# Patient Record
Sex: Female | Born: 1960 | Race: White | Hispanic: No | Marital: Married | Smoking: Former smoker
Health system: Southern US, Community
[De-identification: ages and names within clinical notes are randomized; demographics above are authoritative.]

## PROBLEM LIST (undated history)

## (undated) DIAGNOSIS — J8410 Pulmonary fibrosis, unspecified: Secondary | ICD-10-CM

## (undated) DIAGNOSIS — G939 Disorder of brain, unspecified: Secondary | ICD-10-CM

## (undated) DIAGNOSIS — I10 Essential (primary) hypertension: Secondary | ICD-10-CM

## (undated) HISTORY — PX: MELANOMA EXCISION: SHX5266

## (undated) HISTORY — PX: CYST EXCISION: SHX5701

---

## 2009-07-25 ENCOUNTER — Ambulatory Visit: Payer: Self-pay | Admitting: Family Medicine

## 2009-07-26 ENCOUNTER — Ambulatory Visit: Payer: Self-pay | Admitting: Family Medicine

## 2009-08-02 ENCOUNTER — Encounter: Payer: Self-pay | Admitting: Family Medicine

## 2009-08-04 ENCOUNTER — Encounter: Payer: Self-pay | Admitting: Family Medicine

## 2009-08-17 ENCOUNTER — Ambulatory Visit: Payer: Self-pay | Admitting: Internal Medicine

## 2009-08-17 DIAGNOSIS — L270 Generalized skin eruption due to drugs and medicaments taken internally: Secondary | ICD-10-CM | POA: Insufficient documentation

## 2010-06-09 ENCOUNTER — Ambulatory Visit: Payer: Self-pay | Admitting: Emergency Medicine

## 2010-06-10 ENCOUNTER — Telehealth (INDEPENDENT_AMBULATORY_CARE_PROVIDER_SITE_OTHER): Payer: Self-pay | Admitting: *Deleted

## 2010-06-13 ENCOUNTER — Telehealth (INDEPENDENT_AMBULATORY_CARE_PROVIDER_SITE_OTHER): Payer: Self-pay

## 2010-08-30 ENCOUNTER — Ambulatory Visit
Admission: RE | Admit: 2010-08-30 | Discharge: 2010-08-30 | Payer: Self-pay | Source: Home / Self Care | Admitting: Family Medicine

## 2010-08-30 DIAGNOSIS — R05 Cough: Secondary | ICD-10-CM | POA: Insufficient documentation

## 2010-09-01 ENCOUNTER — Encounter: Payer: Self-pay | Admitting: Family Medicine

## 2010-09-02 ENCOUNTER — Telehealth (INDEPENDENT_AMBULATORY_CARE_PROVIDER_SITE_OTHER): Payer: Self-pay | Admitting: *Deleted

## 2010-09-29 NOTE — Progress Notes (Signed)
  Phone Note Outgoing Call Call back at [facility] Phone [phone]   Call placed by: Lajean Saver RN,  June 10, 2010 10:11 AM Call placed to: Patient Action Taken: Phone Call Completed Summary of Call: Return of patient's call. Patient called concerned about her antibiotic because after taking it yesterday she develpoed watery diarrhea and abdominal cramps. I advised her to eat a meal and wait about 30 minutes before taking it again.

## 2010-09-29 NOTE — Miscellaneous (Signed)
Summary: FLU SHOT FORM  FLU SHOT FORM   Imported By: Dannette Barbara 06/09/2010 12:16:24  _____________________________________________________________________  External Attachment:    Type:   Image     Comment:   External Document

## 2010-09-29 NOTE — Letter (Signed)
Summary: CONTROLLED MEDICATIONS PRESCRIPTION POLICY  CONTROLLED MEDICATIONS PRESCRIPTION POLICY   Imported By: Dannette Barbara 06/09/2010 11:32:39  _____________________________________________________________________  External Attachment:    Type:   Image     Comment:   External Document

## 2010-09-29 NOTE — Progress Notes (Signed)
  Phone Note Outgoing Call   Call placed by: Areta Haber CMA,  June 13, 2010 4:19 PM Call placed to: Patient Summary of Call: Courtesy call mess keft on pt's hm vm. Initial call taken by: Areta Haber CMA,  June 13, 2010 4:20 PM

## 2010-09-29 NOTE — Assessment & Plan Note (Signed)
Summary: COUGH AND PAIN ON LEFT SIDE   Vital Signs:  Patient Profile:   50 Years Old Female CC:      Cough x 3 weeks, Pain in left side under ribs during the night while sitting up in bed Height:     66 inches Weight:      225 pounds O2 Sat:      100 % O2 treatment:    Room Air Temp:     99.8 degrees F oral Pulse rate:   87 / minute Pulse rhythm:   regular Resp:     16 per minute BP sitting:   117 / 76  (left arm) Cuff size:   large  Vitals Entered By: Emilio Math (June 09, 2010 11:32 AM)                  Current Allergies (reviewed today): ! AMOXICILLIN ! KEFLEX ! CORTISONE ! ADHESIVE TAPEHistory of Present Illness History from: patient Chief Complaint: Cough x 3 weeks, Pain in left side under ribs during the night while sitting up in bed History of Present Illness: Cough for 3 weeks.  Started as URI, then passed to husband, then passed back to her.  At that time, was getting up from laying down and felt a quick twinge in LUQ.  Normal recent cardiac workup due to family history. Pain lasted a few seconds and then went away, but had pain with deep breaths and coughs for a few days after that.  Now mostly better.  Currently still with coughing, URI symptoms lasting a few weeks.  No true CP, SOB, radiation of pain, diaphoresis, fever, chills, N/V. +Diarrhea since she started Topamax a few months ago.  Current Meds NAPROSYN 500 MG TABS (NAPROXEN)  TOPAMAX 50 MG TABS (TOPIRAMATE)  PEPCID 20 MG TABS (FAMOTIDINE)  ZYRTEC ALLERGY 10 MG TABS (CETIRIZINE HCL)  DIPHENHYDRAMINE HCL 25 MG CAPS (DIPHENHYDRAMINE HCL)  ASPIRIN 325 MG TABS (ASPIRIN)  ADVIL 200 MG TABS (IBUPROFEN)  PREDNISONE (PAK) 10 MG TABS (PREDNISONE) use as directed (6 day pack) ZITHROMAX Z-PAK 250 MG TABS (AZITHROMYCIN) use as directed  REVIEW OF SYSTEMS Constitutional Symptoms      Denies fever, chills, night sweats, weight loss, weight gain, and fatigue.  Eyes       Denies change in vision, eye pain, eye  discharge, glasses, contact lenses, and eye surgery. Ear/Nose/Throat/Mouth       Complains of sinus problems.      Denies hearing loss/aids, change in hearing, ear pain, ear discharge, dizziness, frequent runny nose, frequent nose bleeds, sore throat, hoarseness, and tooth pain or bleeding.  Respiratory       Complains of dry cough.      Denies productive cough, wheezing, shortness of breath, asthma, bronchitis, and emphysema/COPD.  Cardiovascular       Denies murmurs, chest pain, and tires easily with exhertion.    Gastrointestinal       Denies stomach pain, nausea/vomiting, diarrhea, constipation, blood in bowel movements, and indigestion. Genitourniary       Denies painful urination, kidney stones, and loss of urinary control. Neurological       Complains of headaches.      Denies paralysis, seizures, and fainting/blackouts. Musculoskeletal       Complains of muscle pain.      Denies joint pain, joint stiffness, decreased range of motion, redness, swelling, muscle weakness, and gout.  Skin       Denies bruising, unusual mles/lumps or sores, and hair/skin or  nail changes.  Psych       Denies mood changes, temper/anger issues, anxiety/stress, speech problems, depression, and sleep problems.  Past History:  Past Medical History: Reviewed history from 07/26/2009 and no changes required. brain lesion migraines chronic back pain Hypertension  Past Surgical History: Reviewed history from 07/26/2009 and no changes required. tubal cyst removal malignant melanoma  Family History: Reviewed history from 07/26/2009 and no changes required. Family History High cholesterol Family History Hypertension  Social History: Reviewed history from 07/26/2009 and no changes required. Occupation:Teacher Married Former Smoker Alcohol use-no Drug use-no Physical Exam General appearance: well developed, well nourished, no acute distress Ears: normal, no lesions or deformities Nasal: mucosa  pink, nonedematous, no septal deviation, turbinates normal Oral/Pharynx: tongue normal, posterior pharynx without erythema or exudate Chest/Lungs: no rales, wheezes, or rhonchi bilateral, breath sounds equal without effort Heart: regular rate and  rhythm, no murmur Abdomen: TTP LUQ and along the L 10th rib intercostal area Skin: no obvious rashes or lesions MSE: oriented to time, place, and person Assessment New Problems: UPPER RESPIRATORY INFECTION, ACUTE (ICD-465.9)  URI with cough due to allergic rhinitis, pain is likely costochondritis and is improved (normal recent cardiac workup)  Patient Education: Patient and/or caregiver instructed in the following: rest, fluids, Ibuprofen prn.  Plan New Medications/Changes: ZITHROMAX Z-PAK 250 MG TABS (AZITHROMYCIN) use as directed  #1 x 0, 06/09/2010, Hoyt Koch MD PREDNISONE (PAK) 10 MG TABS (PREDNISONE) use as directed (6 day pack)  #QS x 0, 06/09/2010, Hoyt Koch MD  New Orders: Est. Patient Level III [16109] Influenza non-MCR [00028] Planning Comments:   Antibiotic as directed Use the prednisone as directed (if any reaction, stop med and call physician) Rest, hydration, OTC cough meds Follow-up with your primary care physician if not improving or if getting worse. If CP, SOB, sweating, worsening chest symptoms, go directly to ER   The patient and/or caregiver has been counseled thoroughly with regard to medications prescribed including dosage, schedule, interactions, rationale for use, and possible side effects and they verbalize understanding.  Diagnoses and expected course of recovery discussed and will return if not improved as expected or if the condition worsens. Patient and/or caregiver verbalized understanding.  Prescriptions: ZITHROMAX Z-PAK 250 MG TABS (AZITHROMYCIN) use as directed  #1 x 0   Entered and Authorized by:   Hoyt Koch MD   Signed by:   Hoyt Koch MD on 06/09/2010   Method used:    Print then Give to Patient   RxID:   [id number] PREDNISONE (PAK) 10 MG TABS (PREDNISONE) use as directed (6 day pack)  #QS x 0   Entered and Authorized by:   Hoyt Koch MD   Signed by:   Hoyt Koch MD on 06/09/2010   Method used:   Print then Give to Patient   RxID:   [id number]   Orders Added: 1)  Est. Patient Level III [29528] 2)  Influenza non-MCR [00028]   Influenza Vaccine    Mfr: Fluzone    Dose: 0.5 ml    Route: IM    Given by: Emilio Math    Exp. Date: 02/27/2011    Lot #: [id number]    VIS given: 03/24/10 version given June 09, 2010.  Flu Vaccine Consent Questions    Do you have a history of severe allergic reactions to this vaccine? no    Any prior history of allergic reactions to egg and/or gelatin? no    Do you have a sensitivity to the preservative Thimersol? no  Do you have a past history of Guillan-Barre Syndrome? no    Do you currently have an acute febrile illness? no    Have you ever had a severe reaction to latex? no    Vaccine information given and explained to patient? yes    Are you currently pregnant? no

## 2010-10-01 NOTE — Assessment & Plan Note (Signed)
Summary: COUGH,DIZZINESS,NAUSEA,VOMITING,WEAK,HEADACHE/WSE room 5   Vital Signs:  Patient Profile:   50 Years Old Female CC:      cough, N/V, dizzy Height:     66 inches Weight:      230.50 pounds O2 Sat:      96 % O2 treatment:    Room Air Temp:     99.7 degrees F oral Pulse rate:   97 / minute Resp:     18 per minute BP sitting:   134 / 83  (left arm) Cuff size:   large  Vitals Entered By: Clemens Catholic LPN (August 30, 2010 5:52 PM)                  Updated Prior Medication List: TOPAMAX 50 MG TABS (TOPIRAMATE)  PEPCID 20 MG TABS (FAMOTIDINE)  ZYRTEC ALLERGY 10 MG TABS (CETIRIZINE HCL)  DIPHENHYDRAMINE HCL 25 MG CAPS (DIPHENHYDRAMINE HCL)  ADVIL 200 MG TABS (IBUPROFEN)   Current Allergies (reviewed today): ! AMOXICILLIN ! KEFLEX ! CORTISONE ! ADHESIVE TAPEHistory of Present Illness Chief Complaint: cough, N/V, dizzy History of Present Illness:  Subjective:  Patient complains of a persistent cough.  She states that she was treated for pneumonia in December with Biaxin for  two weeks.  She states that she had a CT scan in December and that there are two calcified granulomas in her left chest that are being "watched."  Over the past 4 days she has developed increased cough which awakens her at night.  She has been more fatigued and developed low grade fever today.  She has occasional wheezing and anterior chest tightness with deep inspiration.  She states that she has had a flu shot.  No recent increase in leg swelling.  REVIEW OF SYSTEMS Constitutional Symptoms      Denies fever, chills, night sweats, weight loss, weight gain, and fatigue.  Eyes       Complains of glasses and contact lenses.      Denies change in vision, eye pain, eye discharge, and eye surgery. Ear/Nose/Throat/Mouth       Complains of hoarseness.      Denies hearing loss/aids, change in hearing, ear pain, ear discharge, dizziness, frequent runny nose, frequent nose bleeds, sinus problems, sore  throat, and tooth pain or bleeding.  Respiratory       Complains of dry cough, wheezing, shortness of breath, and bronchitis.      Denies productive cough, asthma, and emphysema/COPD.  Cardiovascular       Complains of chest pain and tires easily with exhertion.      Denies murmurs.    Gastrointestinal       Complains of stomach pain, nausea/vomiting, and diarrhea.      Denies constipation, blood in bowel movements, and indigestion. Genitourniary       Denies painful urination, kidney stones, and loss of urinary control. Neurological       Complains of headaches.      Denies paralysis, seizures, and fainting/blackouts. Musculoskeletal       Denies muscle pain, joint pain, joint stiffness, decreased range of motion, redness, swelling, muscle weakness, and gout.  Skin       Denies bruising, unusual mles/lumps or sores, and hair/skin or nail changes.  Psych       Denies mood changes, temper/anger issues, anxiety/stress, speech problems, depression, and sleep problems. Other Comments: pt statest that she saw her PCP 3-4 wks ago and had a chest CT performed which showed pneumonia, hse was treated  with 2 rounds of abt.  she completed abt on 08/24/10 and started with cough, dizzy, N/V/D, fatigue, HA, low grade fever x 2-3 days ago.   Past History:  Past Medical History: Reviewed history from 07/26/2009 and no changes required. brain lesion migraines chronic back pain Hypertension  Past Surgical History: Reviewed history from 07/26/2009 and no changes required. tubal cyst removal malignant melanoma  Family History: Family History High cholesterol Family History Hypertension MI   Objective:  No acute distress  Eyes:  Pupils are equal, round, and reactive to light and accomdation.  Extraocular movement is intact.  Conjunctivae are not inflamed.  Ears:  Canals normal.  Tympanic membranes normal.   Nose:  Minimal congestion Pharynx:  Normal  Neck:  Supple.  No adenopathy is  present.   Lungs:  Clear to auscultation.  Breath sounds are equal.  Heart:  Regular rate and rhythm without murmurs, rubs, or gallops.  Abdomen:  Nontender without masses or hepatosplenomegaly.  Bowel sounds are present.  No CVA or flank tenderness.  Extremities:  No edema.  Skin:  No rash CBC:  WBC 6.6 Assessment New Problems: COUGH (ICD-786.2)  WILL DEFER CHEST X-RAY BECAUSE SHE RECENTLY HAD CT CHEST AND LUNGS CLEAR TODAY.  NORMAL WHITE COUNT TODAY. SUSPECT BRONCHOSPASM.  Plan New Medications/Changes: BENZONATATE 200 MG CAPS (BENZONATATE) One by mouth hs as needed cough  #12 x 0, 08/30/2010, Donna Christen MD PROAIR HFA 108 (90 BASE) MCG/ACT AERS (ALBUTEROL SULFATE) Two inhalations q4-6hr as needed.  Max 12 puffs/day  #1 MDI x 1, 08/30/2010, Donna Christen MD ADVAIR DISKUS 100-50 MCG/DOSE MISC (FLUTICASONE-SALMETEROL) 1 inhalation two times a day  #1 disp pk 60 x 1, 08/30/2010, Donna Christen MD  New Orders: Pulse Oximetry [94760] Est. Patient Level IV [91478] CBC w/Diff [[id number]] Planning Comments:   Begin a trial of Advair two times a day, and albuterol inhaler as needed.  Advised to stop any antihistamines for now. If not improving recommend follow-up with pulmonologist.   The patient and/or caregiver has been counseled thoroughly with regard to medications prescribed including dosage, schedule, interactions, rationale for use, and possible side effects and they verbalize understanding.  Diagnoses and expected course of recovery discussed and will return if not improved as expected or if the condition worsens. Patient and/or caregiver verbalized understanding.  Prescriptions: BENZONATATE 200 MG CAPS (BENZONATATE) One by mouth hs as needed cough  #12 x 0   Entered and Authorized by:   Donna Christen MD   Signed by:   Donna Christen MD on 08/30/2010   Method used:   Print then Give to Patient   RxID:   [id number] PROAIR HFA 108 (90 BASE) MCG/ACT AERS (ALBUTEROL SULFATE)  Two inhalations q4-6hr as needed.  Max 12 puffs/day  #1 MDI x 1   Entered and Authorized by:   Donna Christen MD   Signed by:   Donna Christen MD on 08/30/2010   Method used:   Print then Give to Patient   RxID:   [id number] ADVAIR DISKUS 100-50 MCG/DOSE MISC (FLUTICASONE-SALMETEROL) 1 inhalation two times a day  #1 disp pk 60 x 1   Entered and Authorized by:   Donna Christen MD   Signed by:   Donna Christen MD on 08/30/2010   Method used:   Print then Give to Patient   RxID:   [id number]   Patient Instructions: 1)  Take Mucinex  (guaifenesin) twice daily for congestion. 2)  Increase fluid intake, rest. 3)    4)  Followup with family doctor if fever persists or increasing shortness of breath, chest pain develop.   Orders Added: 1)  Pulse Oximetry [94760] 2)  Est. Patient Level IV [40102] 3)  CBC w/Diff [[id number]]

## 2010-10-01 NOTE — Progress Notes (Signed)
  Phone Note Outgoing Call Call back at [facility] Phone [phone]   Call placed by: Lajean Saver RN,  September 02, 2010 11:52 AM Call placed to: Patient Action Taken: Phone Call Completed Summary of Call: Callback: Patient reports she is feeling much better.

## 2011-01-14 ENCOUNTER — Inpatient Hospital Stay (INDEPENDENT_AMBULATORY_CARE_PROVIDER_SITE_OTHER)
Admission: RE | Admit: 2011-01-14 | Discharge: 2011-01-14 | Disposition: A | Discharge status: Home / Self Care | Payer: 59 | Source: Ambulatory Visit | Attending: Emergency Medicine | Admitting: Emergency Medicine

## 2011-01-14 ENCOUNTER — Encounter: Payer: Self-pay | Admitting: Emergency Medicine

## 2011-01-14 DIAGNOSIS — L2089 Other atopic dermatitis: Secondary | ICD-10-CM | POA: Insufficient documentation

## 2011-01-14 DIAGNOSIS — J069 Acute upper respiratory infection, unspecified: Secondary | ICD-10-CM

## 2011-01-14 LAB — CONVERTED CEMR LAB: Rapid Strep: NEGATIVE

## 2011-01-16 ENCOUNTER — Telehealth (INDEPENDENT_AMBULATORY_CARE_PROVIDER_SITE_OTHER): Payer: Self-pay

## 2011-02-19 ENCOUNTER — Encounter: Payer: Self-pay | Admitting: Emergency Medicine

## 2011-02-19 ENCOUNTER — Inpatient Hospital Stay (INDEPENDENT_AMBULATORY_CARE_PROVIDER_SITE_OTHER)
Admission: RE | Admit: 2011-02-19 | Discharge: 2011-02-19 | Disposition: A | Discharge status: Home / Self Care | Payer: 59 | Source: Ambulatory Visit | Attending: Emergency Medicine | Admitting: Emergency Medicine

## 2011-02-19 DIAGNOSIS — J069 Acute upper respiratory infection, unspecified: Secondary | ICD-10-CM

## 2011-02-19 DIAGNOSIS — R350 Frequency of micturition: Secondary | ICD-10-CM

## 2011-02-19 DIAGNOSIS — R05 Cough: Secondary | ICD-10-CM

## 2011-02-19 LAB — CONVERTED CEMR LAB
Blood in Urine, dipstick: NEGATIVE
Glucose, Urine, Semiquant: NEGATIVE
Specific Gravity, Urine: 1.015
WBC Urine, dipstick: NEGATIVE
pH: 7.5

## 2011-02-22 ENCOUNTER — Telehealth (INDEPENDENT_AMBULATORY_CARE_PROVIDER_SITE_OTHER): Payer: Self-pay | Admitting: *Deleted

## 2011-03-03 ENCOUNTER — Inpatient Hospital Stay (INDEPENDENT_AMBULATORY_CARE_PROVIDER_SITE_OTHER)
Admission: RE | Admit: 2011-03-03 | Discharge: 2011-03-03 | Disposition: A | Discharge status: Home / Self Care | Payer: 59 | Source: Ambulatory Visit | Attending: Emergency Medicine | Admitting: Emergency Medicine

## 2011-03-03 ENCOUNTER — Encounter: Payer: Self-pay | Admitting: Emergency Medicine

## 2011-03-03 DIAGNOSIS — J069 Acute upper respiratory infection, unspecified: Secondary | ICD-10-CM

## 2011-03-03 DIAGNOSIS — R05 Cough: Secondary | ICD-10-CM

## 2011-03-04 ENCOUNTER — Telehealth (INDEPENDENT_AMBULATORY_CARE_PROVIDER_SITE_OTHER): Payer: Self-pay | Admitting: Emergency Medicine

## 2011-04-26 ENCOUNTER — Encounter: Payer: Self-pay | Admitting: Family Medicine

## 2011-04-26 ENCOUNTER — Inpatient Hospital Stay (INDEPENDENT_AMBULATORY_CARE_PROVIDER_SITE_OTHER)
Admission: RE | Admit: 2011-04-26 | Discharge: 2011-04-26 | Disposition: A | Discharge status: Home / Self Care | Payer: 59 | Source: Ambulatory Visit | Attending: Family Medicine | Admitting: Family Medicine

## 2011-04-26 DIAGNOSIS — L02419 Cutaneous abscess of limb, unspecified: Secondary | ICD-10-CM

## 2011-05-23 ENCOUNTER — Encounter: Payer: Self-pay | Admitting: Family Medicine

## 2011-05-23 ENCOUNTER — Inpatient Hospital Stay (INDEPENDENT_AMBULATORY_CARE_PROVIDER_SITE_OTHER)
Admission: RE | Admit: 2011-05-23 | Discharge: 2011-05-23 | Disposition: A | Discharge status: Home / Self Care | Payer: 59 | Source: Ambulatory Visit | Attending: Family Medicine | Admitting: Family Medicine

## 2011-05-23 DIAGNOSIS — H16109 Unspecified superficial keratitis, unspecified eye: Secondary | ICD-10-CM

## 2011-08-02 NOTE — Progress Notes (Signed)
Summary: Rash All Over rm 4   Vital Signs:  Patient Profile:   50 Years Old Female CC:      rash all over x 1 day Height:     66 inches Weight:      229.75 pounds O2 Sat:      97 % O2 treatment:    Room Air Temp:     99.7 degrees F oral Pulse rate:   105 / minute Resp:     16 per minute BP sitting:   135 / 84  (left arm) Cuff size:   large  Vitals Entered By: Clemens Catholic LPN (Jan 14, 2011 11:03 AM)                  Updated Prior Medication List: TOPAMAX 50 MG TABS (TOPIRAMATE)  PEPCID 20 MG TABS (FAMOTIDINE)  ZYRTEC ALLERGY 10 MG TABS (CETIRIZINE HCL)  DIPHENHYDRAMINE HCL 25 MG CAPS (DIPHENHYDRAMINE HCL)  ADVAIR DISKUS 100-50 MCG/DOSE MISC (FLUTICASONE-SALMETEROL) 1 inhalation two times a day PROAIR HFA 108 (90 BASE) MCG/ACT AERS (ALBUTEROL SULFATE) Two inhalations q4-6hr as needed.  Max 12 puffs/day  Current Allergies: ! AMOXICILLIN ! KEFLEX ! CORTISONE ! PENICILLIN ! ADHESIVE TAPEHistory of Present Illness History from: patient Chief Complaint: rash all over x 1 day History of Present Illness: Widespread rash all over body for the last day.  It started on her arms and is now everywhere.  It is itchy.  She is not using any meds.  No new meds, soaps, shampoos, detergents, pets, recent travel, new foods, clothes, jewelry, etc.  No known reason why she should be breaking out.  No SOB, CP.  She has had a recent URI, ST, mild fever, and had a migraine last week.    REVIEW OF SYSTEMS Constitutional Symptoms      Denies fever, chills, night sweats, weight loss, weight gain, and fatigue.  Eyes       Complains of change in vision and glasses.      Denies eye pain, eye discharge, contact lenses, and eye surgery. Ear/Nose/Throat/Mouth       Complains of sinus problems and sore throat.      Denies hearing loss/aids, change in hearing, ear pain, ear discharge, dizziness, frequent runny nose, frequent nose bleeds, hoarseness, and tooth pain or bleeding.  Respiratory  Complains of dry cough.      Denies productive cough, wheezing, shortness of breath, asthma, bronchitis, and emphysema/COPD.  Cardiovascular       Denies murmurs, chest pain, and tires easily with exhertion.    Gastrointestinal       Denies stomach pain, nausea/vomiting, diarrhea, constipation, blood in bowel movements, and indigestion. Genitourniary       Denies painful urination, kidney stones, and loss of urinary control. Neurological       Complains of headaches.      Denies paralysis, seizures, and fainting/blackouts. Musculoskeletal       Complains of redness, swelling, and muscle weakness.      Denies muscle pain, joint pain, joint stiffness, decreased range of motion, and gout.  Skin       Denies bruising, unusual mles/lumps or sores, and hair/skin or nail changes.  Psych       Complains of anxiety/stress.      Denies mood changes, temper/anger issues, speech problems, depression, and sleep problems. Other Comments: pt c/o rash all over x 1 day. she states that she used the advair inhaler that was previously rx'ed and thought it could be an  allergic reaction. she has taken Benadryl and Zyrtec with no relief. she also states that on Sunday she was having problems with her RT eye and tenderness behind her RT ear.  she has had a cold/sinus problem with cough, sore throat, and HA.   Past History:  Past Medical History: brain lesion migraines chronic back pain Hypertension lung- calcified granuloma hx of cellulitis  Past Surgical History: Reviewed history from 07/26/2009 and no changes required. tubal cyst removal malignant melanoma  Family History: Family History High cholesterol MI father- deceased- hypertension and ? blood clot mother -living-  Family History Osteoporosis  Social History: Reviewed history from 07/26/2009 and no changes required. Occupation:Teacher Married Former Smoker Alcohol use-no Drug use-no Physical Exam General appearance: well developed,  well nourished, no acute distress Ears: normal, no lesions or deformities Nasal: mucosa pink, nonedematous, no septal deviation, turbinates normal Oral/Pharynx: tongue normal, posterior pharynx without erythema or exudate Neck: neck supple,  trachea midline, no masses Chest/Lungs: no rales, wheezes, or rhonchi bilateral, breath sounds equal without effort Heart: regular rate and  rhythm, no murmur Widespread lacy and macular fine rash on her entire body, worse on extremities. Assessment New Problems: DERMATITIS, ATOPIC (ICD-691.8) UPPER RESPIRATORY INFECTION, ACUTE (ICD-465.9) FAMILY HISTORY OSTEOPOROSIS (ICD-V17.8)   Plan New Medications/Changes: PREDNISONE (PAK) 10 MG TABS (PREDNISONE) 6 day pack, use as directed  #1 x 0, 01/14/2011, Hoyt Koch MD  New Orders: Est. Patient Level IV [40981] Pulse Oximetry (single measurment) [94760] Rapid Strep [19147] Solumedrol up to 125mg  [J2930] Admin of Therapeutic Inj  intramuscular or subcutaneous [96372] Planning Comments:   Gave IM Solumedrol + Rx for pred taper.  Cool showers/compresses. Avoid further irritation and try to think of things that are new recently (meds, foods, etc).  Can consider OTC antihistamines (benedryl) for itching or topical cortisone on the worst areas. If not improving or if worsening, see your PCP or dermatologist.  Rapid strep negative.   The patient and/or caregiver has been counseled thoroughly with regard to medications prescribed including dosage, schedule, interactions, rationale for use, and possible side effects and they verbalize understanding.  Diagnoses and expected course of recovery discussed and will return if not improved as expected or if the condition worsens. Patient and/or caregiver verbalized understanding.  Prescriptions: PREDNISONE (PAK) 10 MG TABS (PREDNISONE) 6 day pack, use as directed  #1 x 0   Entered and Authorized by:   Hoyt Koch MD   Signed by:   Hoyt Koch MD on  01/14/2011   Method used:   Print then Give to Patient   RxID:   [id number]   Medication Administration  Injection # 1:    Medication: Solumedrol up to 125mg     Diagnosis: DERMATITIS, ATOPIC (ICD-691.8)    Route: IM    Site: RUOQ gluteus    Exp Date: 02/27/2013    Lot #: [id number]    Mfr: [employer]    Patient tolerated injection without complications    Given by: Clemens Catholic LPN (Jan 14, 2011 11:29 AM)  Orders Added: 1)  Est. Patient Level IV [96295] 2)  Pulse Oximetry (single measurment) [94760] 3)  Rapid Strep [28413] 4)  Solumedrol up to 125mg  [J2930] 5)  Admin of Therapeutic Inj  intramuscular or subcutaneous [96372]    Laboratory Results  Date/Time Received: Jan 14, 2011 11:10 AM  Date/Time Reported: Jan 14, 2011 11:10 AM   Other Tests  Rapid Strep: negative  Kit Test Internal QC: Negative   (Normal Range: Negative)

## 2011-08-02 NOTE — Progress Notes (Signed)
Summary: ?scratched eye/TM(rm5)   Vital Signs:  Patient Profile:   50 Years Old Female CC:      eye pain Height:     66 inches Weight:      233 pounds O2 Sat:      100 % O2 treatment:    Room Air Temp:     98.4 degrees F oral Pulse rate:   78 / minute Resp:     18 per minute BP sitting:   124 / 81  (left arm) Cuff size:   large  Vitals Entered By: Linton Flemings RN (May 23, 2011 3:03 PM)                  Updated Prior Medication List: TOPAMAX 50 MG TABS (TOPIRAMATE)   Current Allergies (reviewed today): ! AMOXICILLIN ! KEFLEX ! CORTISONE ! PENICILLIN ! ADHESIVE TAPEHistory of Present Illness Chief Complaint: eye pain History of Present Illness:  Subjective:  Patient states that she left her contact lens in overnight two days ago, and eyes have gradually become irritated with light sensitivity.  Right eye is worse than left.  No changes in vision.  REVIEW OF SYSTEMS Constitutional Symptoms      Denies fever, chills, night sweats, weight loss, weight gain, and fatigue.  Eyes       Complains of eye pain.      Denies change in vision, eye discharge, glasses, contact lenses, and eye surgery. Ear/Nose/Throat/Mouth       Denies hearing loss/aids, change in hearing, ear pain, ear discharge, dizziness, frequent runny nose, frequent nose bleeds, sinus problems, sore throat, hoarseness, and tooth pain or bleeding.  Respiratory       Denies dry cough, productive cough, wheezing, shortness of breath, asthma, bronchitis, and emphysema/COPD.  Cardiovascular       Denies murmurs, chest pain, and tires easily with exhertion.    Gastrointestinal       Denies stomach pain, nausea/vomiting, diarrhea, constipation, blood in bowel movements, and indigestion. Genitourniary       Denies painful urination, kidney stones, and loss of urinary control. Neurological       Denies paralysis, seizures, and fainting/blackouts. Musculoskeletal       Denies muscle pain, joint pain,  joint stiffness, decreased range of motion, redness, swelling, muscle weakness, and gout.  Skin       Denies bruising, unusual mles/lumps or sores, and hair/skin or nail changes.  Psych       Denies mood changes, temper/anger issues, anxiety/stress, speech problems, depression, and sleep problems. Other Comments: fell asleep in contacts on friday and now having eye pain right eye worse than left    Past History:  Past Medical History: Reviewed history from 01/14/2011 and no changes required. brain lesion migraines chronic back pain Hypertension lung- calcified granuloma hx of cellulitis  Past Surgical History: Reviewed history from 07/26/2009 and no changes required. tubal cyst removal malignant melanoma  Family History: Reviewed history from 01/14/2011 and no changes required. Family History High cholesterol MI father- deceased- hypertension and ? blood clot mother -living-  Family History Osteoporosis  Social History: Reviewed history from 07/26/2009 and no changes required. Occupation:Teacher Married Former Smoker Alcohol use-no Drug use-no   Objective:  no acute distress  Eyes:  Pupils are equal, round, and reactive to light and accomodation.  Extraocular movement is intact.  Conjunctivae are not inflamed.  Mild photophobia.  Fundi benign.  Lid eversion reveals no foreign body.  Fluorescein reveals no uptake. Assessment New Problems: UNSPECIFIED  SUPERFICIAL KERATITIS (ICD-370.20)  SUSPECT MILD KERATITIS  Plan New Medications/Changes: SULFACETAMIDE-PREDNISOLONE 10-0.2 % SUSP (SULFACETAMIDE-PREDNISOLONE) 2gtts OU q4hr while awake  #5cc x 0, 05/23/2011, Donna Christen MD  New Orders: Est. Patient Level III [phone] Services provided After hours-Weekends-Holidays [99051] Planning Comments:   Begin sulfacetamide/prednisone ophth suspension.  Wear sunglasses outside.  Avoid contacts until resolved.  Follow-up with ophthalmologist if not improved two  days.   The patient and/or caregiver has been counseled thoroughly with regard to medications prescribed including dosage, schedule, interactions, rationale for use, and possible side effects and they verbalize understanding.  Diagnoses and expected course of recovery discussed and will return if not improved as expected or if the condition worsens. Patient and/or caregiver verbalized understanding.  Prescriptions: SULFACETAMIDE-PREDNISOLONE 10-0.2 % SUSP (SULFACETAMIDE-PREDNISOLONE) 2gtts OU q4hr while awake  #5cc x 0   Entered and Authorized by:   Donna Christen MD   Signed by:   Donna Christen MD on 05/23/2011   Method used:   Print then Give to Patient   RxID:   [id number]   Orders Added: 1)  Est. Patient Level III [57846] 2)  Services provided After hours-Weekends-Holidays [96295]

## 2011-08-02 NOTE — Progress Notes (Signed)
Summary: COUGH/HIGH FEVER   Vital Signs:  Patient Profile:   50 Years Old Female CC:      fever, cough, nausea x 2 days Height:     66 inches Weight:      225 pounds O2 Sat:      95 % O2 treatment:    Room Air Temp:     99.6 degrees F oral Pulse rate:   100 / minute Resp:     18 per minute BP sitting:   116 / 81  (left arm) Cuff size:   large  Vitals Entered By: Lajean Saver RN (March 03, 2011 2:00 PM)                  Updated Prior Medication List: TOPAMAX 50 MG TABS (TOPIRAMATE)  PEPCID 20 MG TABS (FAMOTIDINE)  ZYRTEC ALLERGY 10 MG TABS (CETIRIZINE HCL)   Current Allergies (reviewed today): ! AMOXICILLIN ! KEFLEX ! CORTISONE ! PENICILLIN ! ADHESIVE TAPEHistory of Present Illness History from: patient Chief Complaint: fever, cough, nausea x 2 days History of Present Illness: 50 Years Old Female complains of onset of cold symptoms for a few days.  Jill Huang was here 2 weeks ago with similar symptoms, got mostly better but then got worse again. Nothing new in terms of pets, carpets, fabrics.  No tick bites.  + sore throat + cough No pleuritic pain +/- wheezing + nasal congestion +post-nasal drainage + sinus pain/pressure + chest congestion No itchy/red eyes No earache No hemoptysis No SOB No chills/sweats + fever No nausea + post-tussive vomiting No abdominal pain No diarrhea No skin rashes No fatigue + buttock myalgias No headache   REVIEW OF SYSTEMS Constitutional Symptoms       Complains of fever and fatigue.     Denies chills, night sweats, weight loss, and weight gain.  Eyes       Denies change in vision, eye pain, eye discharge, glasses, contact lenses, and eye surgery. Ear/Nose/Throat/Mouth       Denies hearing loss/aids, change in hearing, ear pain, ear discharge, dizziness, frequent runny nose, frequent nose bleeds, sinus problems, sore throat, hoarseness, and tooth pain or bleeding.  Respiratory       Complains of productive cough.      Denies  dry cough, wheezing, shortness of breath, asthma, bronchitis, and emphysema/COPD.  Cardiovascular       Denies murmurs, chest pain, and tires easily with exhertion.    Gastrointestinal       Complains of nausea/vomiting.      Denies stomach pain, diarrhea, constipation, blood in bowel movements, and indigestion. Genitourniary       Denies painful urination, kidney stones, and loss of urinary control. Neurological       Denies paralysis, seizures, and fainting/blackouts. Musculoskeletal       Complains of muscle pain and joint pain.      Denies joint stiffness, decreased range of motion, redness, swelling, muscle weakness, and gout.      Comments: body aches Skin       Denies bruising, unusual mles/lumps or sores, and hair/skin or nail changes.  Psych       Denies mood changes, temper/anger issues, anxiety/stress, speech problems, depression, and sleep problems.  Past History:  Past Medical History: Reviewed history from 01/14/2011 and no changes required. brain lesion migraines chronic back pain Hypertension lung- calcified granuloma hx of cellulitis  Past Surgical History: Reviewed history from 07/26/2009 and no changes required. tubal cyst removal malignant melanoma  Family History: Reviewed history from 01/14/2011 and no changes required. Family History High cholesterol MI father- deceased- hypertension and ? blood clot mother -living-  Family History Osteoporosis  Social History: Reviewed history from 07/26/2009 and no changes required. Occupation:Teacher Married Former Smoker Alcohol use-no Drug use-no Physical Exam General appearance: well developed, well nourished, no acute distress Ears: normal, no lesions or deformities Nasal: mucosa pink, nonedematous, no septal deviation, turbinates normal Oral/Pharynx: pharyngeal R erythema without exudate, uvula midline without deviation Neck: neck supple,  trachea midline, no masses Chest/Lungs: no rales, wheezes,  or rhonchi bilateral, breath sounds equal without effort Heart: regular rate and  rhythm, no murmur MSE: oriented to time, place, and person  Plan New Medications/Changes: TESSALON 200 MG CAPS (BENZONATATE) 1 by mouth three times a day as needed for cough  #21 x 0, 03/03/2011, Hoyt Koch MD PREDNISONE (PAK) 10 MG TABS (PREDNISONE) use as directed  #1 x 0, 03/03/2011, Hoyt Koch MD CLINDAMYCIN HCL 300 MG CAPS (CLINDAMYCIN HCL) 1 by mouth three times a day for 10 days  #30 x 0, 03/03/2011, Hoyt Koch MD  New Orders: Rapid Strep [16109] Pulse Oximetry (single measurment) [94760] Est. Patient Level IV [60454] Planning Comments:   1)  Take the prescribed antibiotic as instructed.  Rapid strep negative.  Will give prednisone this time too.  She commented that her sister was supposedly dx w/ Chad Nile and she wonders if she needs testing.  With her symptoms seeming only URI, would first treat that.  If not improving, or if worsening again, consider CXR, repeat CBC (last one showed barely elevated lymphs but o/w normal), or referral to ENT for eval.  I don't see any evidence today for any red flags. 2)  Use nasal saline solution (over the counter) at least 3 times a day. 3)  Use over the counter decongestants like Zyrtec-D every 12 hours as needed to help with congestion. 4)  Can take tylenol every 6 hours or motrin every 8 hours for pain or fever. 5)  Follow up with your primary doctor  if no improvement in 5-7 days, sooner if increasing pain, fever, or new symptoms.    The patient and/or caregiver has been counseled thoroughly with regard to medications prescribed including dosage, schedule, interactions, rationale for use, and possible side effects and they verbalize understanding.  Diagnoses and expected course of recovery discussed and will return if not improved as expected or if the condition worsens. Patient and/or caregiver verbalized understanding.   Prescriptions: TESSALON 200 MG CAPS (BENZONATATE) 1 by mouth three times a day as needed for cough  #21 x 0   Entered and Authorized by:   Hoyt Koch MD   Signed by:   Hoyt Koch MD on 03/03/2011   Method used:   Print then Give to Patient   RxID:   [id number] PREDNISONE (PAK) 10 MG TABS (PREDNISONE) use as directed  #1 x 0   Entered and Authorized by:   Hoyt Koch MD   Signed by:   Hoyt Koch MD on 03/03/2011   Method used:   Print then Give to Patient   RxID:   [id number] CLINDAMYCIN HCL 300 MG CAPS (CLINDAMYCIN HCL) 1 by mouth three times a day for 10 days  #30 x 0   Entered and Authorized by:   Hoyt Koch MD   Signed by:   Hoyt Koch MD on 03/03/2011   Method used:   Print then Give to Patient   RxID:   [id number]  Orders Added: 1)  Rapid Strep [91478] 2)  Pulse Oximetry (single measurment) [94760] 3)  Est. Patient Level IV [29562]    Laboratory Results  Date/Time Received: March 03, 2011 2:09 PM  Date/Time Reported: March 03, 2011 2:09 PM   Other Tests  Rapid Strep: negative  Kit Test Internal QC: Negative   (Normal Range: Negative)

## 2011-08-02 NOTE — Progress Notes (Signed)
Summary: fever,aches,cough rm 4   Vital Signs:  Patient Profile:   50 Years Old Female CC:      fever, cough x 2 days Height:     66 inches Weight:      229 pounds O2 Sat:      97 % O2 treatment:    Room Air Temp:     102.0 degrees F oral Pulse rate:   112 / minute Resp:     18 per minute BP sitting:   138 / 88  (left arm) Cuff size:   large  Vitals Entered By: Clemens Catholic LPN (February 19, 2011 4:23 PM)                  Updated Prior Medication List: TOPAMAX 50 MG TABS (TOPIRAMATE)  PEPCID 20 MG TABS (FAMOTIDINE)  ZYRTEC ALLERGY 10 MG TABS (CETIRIZINE HCL)   Current Allergies (reviewed today): ! AMOXICILLIN ! KEFLEX ! CORTISONE ! PENICILLIN ! ADHESIVE TAPEHistory of Present Illness History from: patient Chief Complaint: fever, cough x 2 days History of Present Illness: 50 Years Old Female complains of onset of cold symptoms for 3 days.  Jill Huang has been using Advil which is helping a little bit. No sore throat +cough No pleuritic pain No wheezing + nasal congestion No post-nasal drainage No sinus pain/pressure No chest congestion No itchy/red eyes No earache No hemoptysis No SOB + chills/sweats + fever + nausea No vomiting No abdominal pain No diarrhea No skin rashes + fatigue + myalgias No headache  Perhaps small amount of polyuria  REVIEW OF SYSTEMS Constitutional Symptoms       Complains of fever, chills, night sweats, and fatigue.     Denies weight loss and weight gain.  Eyes       Complains of change in vision and glasses.      Denies eye pain, eye discharge, contact lenses, and eye surgery. Ear/Nose/Throat/Mouth       Denies hearing loss/aids, change in hearing, ear pain, ear discharge, dizziness, frequent runny nose, frequent nose bleeds, sinus problems, sore throat, hoarseness, and tooth pain or bleeding.  Respiratory       Complains of productive cough and shortness of breath.      Denies dry cough, wheezing, asthma, bronchitis, and  emphysema/COPD.  Cardiovascular       Complains of tires easily with exhertion.      Denies murmurs and chest pain.    Gastrointestinal       Denies stomach pain, nausea/vomiting, diarrhea, constipation, blood in bowel movements, and indigestion.      Comments: nausea Genitourniary       Denies painful urination, kidney stones, and loss of urinary control. Neurological       Denies paralysis, seizures, and fainting/blackouts. Musculoskeletal       Complains of muscle pain and joint pain.      Denies joint stiffness, decreased range of motion, redness, swelling, muscle weakness, and gout.  Skin       Denies bruising, unusual mles/lumps or sores, and hair/skin or nail changes.  Psych       Denies mood changes, temper/anger issues, anxiety/stress, speech problems, depression, and sleep problems. Other Comments: pt c/o fever, achy, productive cough, and nausea x 2 days. she has taken IBF. her son had pneumonia 2 wks ago.   Past History:  Past Medical History: Reviewed history from 01/14/2011 and no changes required. brain lesion migraines chronic back pain Hypertension lung- calcified granuloma hx of cellulitis  Past Surgical  History: Reviewed history from 07/26/2009 and no changes required. tubal cyst removal malignant melanoma  Family History: Reviewed history from 01/14/2011 and no changes required. Family History High cholesterol MI father- deceased- hypertension and ? blood clot mother -living-  Family History Osteoporosis  Social History: Reviewed history from 07/26/2009 and no changes required. Occupation:Teacher Married Former Smoker Alcohol use-no Drug use-no Physical Exam General appearance: well developed, well nourished, mild distress Ears: normal, no lesions or deformities Nasal: mucosa pink, nonedematous, no septal deviation, turbinates normal Oral/Pharynx: tongue normal, posterior pharynx without erythema or exudate Neck: neck supple,  trachea  midline, no masses Chest/Lungs: no rales, wheezes, or rhonchi bilateral, breath sounds equal without effort Heart: regular rate and  rhythm, no murmur MSE: oriented to time, place, and person Assessment New Problems: FEVER UNSPECIFIED (ICD-780.60) URINARY FREQUENCY (ICD-788.41) UPPER RESPIRATORY INFECTION (ICD-465.9)   Plan New Medications/Changes: PROMETHAZINE HCL 12.5 MG TABS (PROMETHAZINE HCL) 1 by mouth q6 hrs as needed for nausea  #15 x 0, 02/19/2011, Hoyt Koch MD ZITHROMAX Z-PAK 250 MG TABS (AZITHROMYCIN) use as directed  #1 x 0, 02/19/2011, Hoyt Koch MD  New Orders: Est. Patient Level IV [16109] Pulse Oximetry (single measurment) [94760] UA Dipstick w/o Micro (automated)  [81003] T-Culture, Urine [60454-09811] T-CBC w/Diff [91478-29562] T-Sed Rate (Automated) [13086-57846] Planning Comments:   1)  Take the prescribed antibiotic as instructed.  Lungs sound clear so will hold off on CXR for a few days unless getting worse, not improving, worsening cough, worsening fever, chills, myalgias.  Seems viral URI syndrome.  Will check UA which is normal and culture today with recent polyuria and check CBC w/ diff. which is normal but with slightly high percentage of lymphs.  Also check ESR.  Use nasal saline solution (over the counter) at least 3 times a day.  Can take tylenol every 6 hours or motrin every 8 hours for pain or fever. Follow up with your primary doctor  if no improvement in 5-7 days, sooner if increasing pain, fever, or new symptoms.    The patient and/or caregiver has been counseled thoroughly with regard to medications prescribed including dosage, schedule, interactions, rationale for use, and possible side effects and they verbalize understanding.  Diagnoses and expected course of recovery discussed and will return if not improved as expected or if the condition worsens. Patient and/or caregiver verbalized understanding.  Prescriptions: PROMETHAZINE HCL  12.5 MG TABS (PROMETHAZINE HCL) 1 by mouth q6 hrs as needed for nausea  #15 x 0   Entered and Authorized by:   Hoyt Koch MD   Signed by:   Hoyt Koch MD on 02/19/2011   Method used:   Print then Give to Patient   RxID:   [id number] Z-PAK 250 MG TABS (AZITHROMYCIN) use as directed  #1 x 0   Entered and Authorized by:   Hoyt Koch MD   Signed by:   Hoyt Koch MD on 02/19/2011   Method used:   Print then Give to Patient   RxID:   [id number]   Orders Added: 1)  Est. Patient Level IV [33295] 2)  Pulse Oximetry (single measurment) [94760] 3)  UA Dipstick w/o Micro (automated)  [81003] 4)  T-Culture, Urine [18841-66063] 5)  T-CBC w/Diff [01601-09323] 6)  T-Sed Rate (Automated) [55732-20254]    Laboratory Results   Urine Tests  Date/Time Received: February 19, 2011 5:02 PM  Date/Time Reported: February 19, 2011 5:02 PM   Routine Urinalysis   Color: yellow Appearance: Clear Glucose: negative   (Normal  Range: Negative) Bilirubin: negative   (Normal Range: Negative) Ketone: negative   (Normal Range: Negative) Spec. Gravity: 1.015   (Normal Range: 1.003-1.035) Blood: negative   (Normal Range: Negative) pH: 7.5   (Normal Range: 5.0-8.0) Protein: negative   (Normal Range: Negative) Urobilinogen: 0.2   (Normal Range: 0-1) Nitrite: negative   (Normal Range: Negative) Leukocyte Esterace: negative   (Normal Range: Negative)

## 2011-08-02 NOTE — Telephone Encounter (Signed)
  Phone Note Outgoing Call   Call placed by: Linton Flemings RN,  Jan 16, 2011 2:23 PM Call placed to: Patient Summary of Call: message left with pt concerning status and to call facility w/ questions/concerns

## 2011-08-02 NOTE — Progress Notes (Signed)
Summary: RASH ON LT LEG...WSE(rm4)   Vital Signs:  Patient Profile:   50 Years Old Female CC:      rash on left lef Height:     66 inches Weight:      229 pounds O2 Sat:      97 % O2 treatment:    Room Air Temp:     99.2 degrees F oral Pulse rate:   105 / minute Resp:     18 per minute BP sitting:   140 / 91  (left arm) Cuff size:   large  Vitals Entered By: Linton Flemings RN (April 26, 2011 7:44 PM)                  Prior Medication List:  TOPAMAX 50 MG TABS (TOPIRAMATE)  PEPCID 20 MG TABS (FAMOTIDINE)  ZYRTEC ALLERGY 10 MG TABS (CETIRIZINE HCL)  CLINDAMYCIN HCL 300 MG CAPS (CLINDAMYCIN HCL) 1 by mouth three times a day for 10 days PREDNISONE (PAK) 10 MG TABS (PREDNISONE) use as directed TESSALON 200 MG CAPS (BENZONATATE) 1 by mouth three times a day as needed for cough   Updated Prior Medication List: TOPAMAX 50 MG TABS (TOPIRAMATE)  PEPCID 20 MG TABS (FAMOTIDINE)  ZYRTEC ALLERGY 10 MG TABS (CETIRIZINE HCL)  CLINDAMYCIN HCL 300 MG CAPS (CLINDAMYCIN HCL) 1 by mouth three times a day for 10 days PREDNISONE (PAK) 10 MG TABS (PREDNISONE) use as directed TESSALON 200 MG CAPS (BENZONATATE) 1 by mouth three times a day as needed for cough  Current Allergies (reviewed today): ! AMOXICILLIN ! KEFLEX ! CORTISONE ! PENICILLIN ! ADHESIVE TAPEHistory of Present Illness Chief Complaint: rash on left lef History of Present Illness:  Patient rerports scratching her leg today after having been bitten by a couple of mosquitoes. She had a bad cellulitis last year at [facility] and was hospitalized for 9n days. She is not sure what abntibiotric they had her on but she had what sounded s lile a yeast /diarrhea reaction to augmentin and a anaphlactic response to keflex. She reports some frustration w/her stay at [facility]  Current Problems: CELLULITIS, LEFT LEG (ICD-682.6) FEVER UNSPECIFIED (ICD-780.60) UPPER RESPIRATORY INFECTION (ICD-465.9) DERMATITIS, ATOPIC  (ICD-691.8) FAMILY HISTORY OSTEOPOROSIS (ICD-V17.8) COUGH (ICD-786.2) CUTANEOUS ERUPTIONS, DRUG-INDUCED (ICD-693.0)   Current Meds TOPAMAX 50 MG TABS (TOPIRAMATE)  PEPCID 20 MG TABS (FAMOTIDINE)  ZYRTEC ALLERGY 10 MG TABS (CETIRIZINE HCL)  CLINDAMYCIN HCL 300 MG CAPS (CLINDAMYCIN HCL) 1 by mouth three times a day for 10 days PREDNISONE (PAK) 10 MG TABS (PREDNISONE) use as directed TESSALON 200 MG CAPS (BENZONATATE) 1 by mouth three times a day as needed for cough DOXYCYCLINE HYCLATE 100 MG CAPS (DOXYCYCLINE HYCLATE) Take 1 tab twice a day BACTROBAN 2 % OINT (MUPIROCIN) appply 3 x aday for the next 10 days  REVIEW OF SYSTEMS Constitutional Symptoms      Denies fever, chills, night sweats, weight loss, weight gain, and fatigue.  Eyes       Denies change in vision, eye pain, eye discharge, glasses, contact lenses, and eye surgery. Ear/Nose/Throat/Mouth       Denies hearing loss/aids, change in hearing, ear pain, ear discharge, dizziness, frequent runny nose, frequent nose bleeds, sinus problems, sore throat, hoarseness, and tooth pain or bleeding.  Respiratory       Denies dry cough, productive cough, wheezing, shortness of breath, asthma, bronchitis, and emphysema/COPD.  Cardiovascular       Complains of tires easily with exhertion.      Denies murmurs  and chest pain.    Gastrointestinal       Denies stomach pain, nausea/vomiting, diarrhea, constipation, blood in bowel movements, and indigestion. Genitourniary       Denies painful urination, kidney stones, and loss of urinary control. Neurological       Complains of loss of or changes in sensation.      Denies paralysis, seizures, and fainting/blackouts. Musculoskeletal       Complains of muscle pain, joint pain, redness, and swelling.      Denies decreased range of motion, muscle weakness, and gout.  Skin       Denies bruising, unusual mles/lumps or sores, and hair/skin or nail changes.  Psych       Denies mood changes,  temper/anger issues, anxiety/stress, speech problems, depression, and sleep problems. Other Comments: rash to left lower leg noted today   Past History:  Family History: Last updated: 01/14/2011 Family History High cholesterol MI father- deceased- hypertension and ? blood clot mother -living-  Family History Osteoporosis  Social History: Last updated: 07/26/2009 Occupation:Teacher Married Former Smoker Alcohol use-no Drug use-no  Past Medical History: Reviewed history from 01/14/2011 and no changes required. brain lesion migraines chronic back pain Hypertension lung- calcified granuloma hx of cellulitis  Past Surgical History: Reviewed history from 07/26/2009 and no changes required. tubal cyst removal malignant melanoma  Family History: Reviewed history from 01/14/2011 and no changes required. Family History High cholesterol MI father- deceased- hypertension and ? blood clot mother -living-  Family History Osteoporosis  Social History: Reviewed history from 07/26/2009 and no changes required. Occupation:Teacher Married Former Smoker Alcohol use-no Drug use-no Physical Exam General appearance: obese, well developed, well nourished, no acute distress Head: normocephalic, atraumatic Skin: Marked hyperemic area over her distal left leg 16cm by 16 cm MSE: oriented to time, place, and person Assessment Problems:   FEVER UNSPECIFIED (ICD-780.60) UPPER RESPIRATORY INFECTION (ICD-465.9) DERMATITIS, ATOPIC (ICD-691.8) FAMILY HISTORY OSTEOPOROSIS (ICD-V17.8) COUGH (ICD-786.2) CUTANEOUS ERUPTIONS, DRUG-INDUCED (ICD-693.0) New Problems: CELLULITIS, LEFT LEG (ICD-682.6)   Plan New Medications/Changes: BACTROBAN 2 % OINT (MUPIROCIN) appply 3 x aday for the next 10 days  #1 tube x 1, 04/26/2011, Hassan Rowan MD DOXYCYCLINE HYCLATE 100 MG CAPS (DOXYCYCLINE HYCLATE) Take 1 tab twice a day  #20 x 0, 04/26/2011, Hassan Rowan MD  New Orders: Est. Patient Level III  [16109] Follow Up: Follow up in 1-2 days if no improvement, Follow up with Primary Physician Work/School Excuse: Return to work/school in 3 days  The patient and/or caregiver has been counseled thoroughly with regard to medications prescribed including dosage, schedule, interactions, rationale for use, and possible side effects and they verbalize understanding.  Diagnoses and expected course of recovery discussed and will return if not improved as expected or if the condition worsens. Patient and/or caregiver verbalized understanding.  Prescriptions: BACTROBAN 2 % OINT (MUPIROCIN) appply 3 x aday for the next 10 days  #1 tube x 1   Entered and Authorized by:   Hassan Rowan MD   Signed by:   Hassan Rowan MD on 04/26/2011   Method used:   Printed then faxed to ...       [facility] [phone]* (retail)       [address] [place], [place]  [address]       Ph: [phone]       Fax: [phone]   RxID:   [id number] DOXYCYCLINE HYCLATE 100 MG CAPS (DOXYCYCLINE HYCLATE) Take 1 tab twice a day  #  20 x 0   Entered and Authorized by:   Hassan Rowan MD   Signed by:   Hassan Rowan MD on 04/26/2011   Method used:   Printed then faxed to ...       [facility] [phone]* (retail)       [address] [place], [place]  [address]       Ph: [phone]       Fax: [phone]   RxID:   [id number]   Patient Instructions: 1)  Please schedule a follow-up appointment as needed. 2)  Please schedule an appointment with your primary doctor in :1-2 days 3)  Take your antibiotic as prescribed until ALL of it is gone, but stop if you develop a rash or swelling and contact our office as soon as possible. 4)  Recommended remaining out of work for rest of the week. 5)  If rash gets worse tonight or tomorrow go to the ED of choice may need some IV antibiotics or admisssion  Orders Added: 1)  Est. Patient Level III [53664]

## 2011-08-02 NOTE — Telephone Encounter (Signed)
  Phone Note Outgoing Call Call back at [facility] Phone [phone]   Call placed by: Lavell Islam RN,  March 04, 2011 1:12 PM Call placed to: Patient Action Taken: Phone Call Completed Summary of Call: Spoke with patient who is marginally better at 24 hours post visit; encouraged her to seek PCP tomorrow if not showing ongoing improvement. Initial call taken by: Lavell Islam RN,  March 04, 2011 1:13 PM

## 2011-08-02 NOTE — Telephone Encounter (Signed)
  Phone Note Outgoing Call Call back at Home Phone [phone] P [facility]     Call placed by: Lajean Saver RN,  February 22, 2011 1:02 PM Call placed to: Patient Action Taken: Phone Call Completed Summary of Call: Callback: Patient reports some imporvement in symptoms. Lab results given. She is still taking her antibiotic. Will call with further questions.

## 2011-09-20 ENCOUNTER — Emergency Department
Admission: EM | Admit: 2011-09-20 | Discharge: 2011-09-20 | Disposition: A | Discharge status: Home / Self Care | Payer: 59 | Source: Home / Self Care | Attending: Emergency Medicine | Admitting: Emergency Medicine

## 2011-09-20 ENCOUNTER — Encounter: Payer: Self-pay | Admitting: *Deleted

## 2011-09-20 DIAGNOSIS — J069 Acute upper respiratory infection, unspecified: Secondary | ICD-10-CM

## 2011-09-20 HISTORY — DX: Disorder of brain, unspecified: G93.9

## 2011-09-20 MED ORDER — DOXYCYCLINE HYCLATE 100 MG PO CAPS: 100.0000 mg | ORAL_CAPSULE | Freq: Two times a day (BID) | ORAL | Status: AC

## 2011-09-20 NOTE — ED Notes (Signed)
Pt c/o sore throat, cough, eye drainage and RT ear fullness x 3 days. She has taken mucinex, benadryl and IBF.

## 2011-09-20 NOTE — ED Provider Notes (Signed)
History     CSN: [id number]  Arrival date & time 09/20/11  Barry Brunner   First MD Initiated Contact with Patient 09/20/11 1940      Chief Complaint  Patient presents with  . Sore Throat  . Cough    (Consider location/radiation/quality/duration/timing/severity/associated sxs/prior treatment) HPI Jill Huang is a 51 y.o. female who complains of onset of cold symptoms for 3 days.  + sore throat + cough No pleuritic pain No wheezing + nasal congestion + post-nasal drainage +sinus pain/pressure No chest congestion + itchy/red eyes + R earache No hemoptysis No SOB No chills/sweats No fever No nausea No vomiting No abdominal pain No diarrhea No skin rashes No fatigue No myalgias No headache    Past Medical History  Diagnosis Date  . Lesion of brain     Past Surgical History  Procedure Date  . Melanoma excision     History reviewed. No pertinent family history.  History  Substance Use Topics  . Smoking status: Never Smoker   . Smokeless tobacco: Not on file  . Alcohol Use: No    OB History    Grav Para Term Preterm Abortions TAB SAB Ect Mult Living                  Review of Systems  Allergies  Amoxicillin; Augmentin; Cephalexin; Cortisone; and Penicillins  Home Medications   Current Outpatient Rx  Name Route Sig Dispense Refill  . CETIRIZINE HCL 10 MG PO TABS Oral Take 10 mg by mouth as needed.    Marland Kitchen DIPHENHYDRAMINE HCL 25 MG PO TABS Oral Take 25 mg by mouth every 6 (six) hours as needed.    Marland Kitchen NORTRIPTYLINE HCL 10 MG PO CAPS Oral Take 10 mg by mouth at bedtime.    . TOPIRAMATE 50 MG PO TABS Oral Take 50 mg by mouth 2 (two) times daily.    Marland Kitchen DOXYCYCLINE HYCLATE 100 MG PO CAPS Oral Take 1 capsule (100 mg total) by mouth 2 (two) times daily. 20 capsule 0    BP 133/84  Pulse 97  Temp(Src) 99.2 F (37.3 C) (Oral)  Resp 18  Ht 5\' 6"  (1.676 m)  Wt 229 lb (103.874 kg)  BMI 36.96 kg/m2  SpO2 97%  LMP 09/05/2011  Physical Exam  Nursing note and vitals  reviewed. Constitutional: She is oriented to person, place, and time. She appears well-developed and well-nourished.  HENT:  Head: Normocephalic and atraumatic.  Right Ear: Tympanic membrane, external ear and ear canal normal.  Left Ear: Tympanic membrane, external ear and ear canal normal.  Nose: Mucosal edema and rhinorrhea present.  Mouth/Throat: Posterior oropharyngeal erythema present. No oropharyngeal exudate or posterior oropharyngeal edema.  Eyes: Right eye exhibits discharge. Left eye exhibits discharge. No scleral icterus.  Neck: Neck supple.  Cardiovascular: Regular rhythm and normal heart sounds.   Pulmonary/Chest: Effort normal and breath sounds normal. No respiratory distress.  Neurological: She is alert and oriented to person, place, and time.  Skin: Skin is warm and dry.  Psychiatric: She has a normal mood and affect. Her speech is normal.    ED Course  Procedures (including critical care time)   Labs Reviewed  POCT RAPID STREP A (OFFICE)  STREP A DNA PROBE   No results found.   1. Acute upper respiratory infections of unspecified site       MDM  1)  Take the prescribed antibiotic as instructed.  She has an early otitis media the right ear. Her eyes appear red but  I do not believe that she has conjunctivitis, however she does have a prescription for antibiotic eyedrops at home that she can use if she starts to get worse. 2)  Use nasal saline solution (over the counter) at least 3 times a day. 3)  Use over the counter decongestants like Zyrtec-D every 12 hours as needed to help with congestion.  If you have hypertension, do not take medicines with sudafed.  4)  Can take tylenol every 6 hours or motrin every 8 hours for pain or fever. 5)  Follow up with your primary doctor if no improvement in 5-7 days, sooner if increasing pain, fever, or new symptoms.       Lily Kocher, MD 09/20/11 2001

## 2011-09-24 ENCOUNTER — Telehealth: Payer: Self-pay | Admitting: Emergency Medicine

## 2013-06-24 ENCOUNTER — Emergency Department
Admission: EM | Admit: 2013-06-24 | Discharge: 2013-06-24 | Disposition: A | Discharge status: Home / Self Care | Payer: 59 | Source: Home / Self Care | Attending: Emergency Medicine | Admitting: Emergency Medicine

## 2013-06-24 ENCOUNTER — Encounter: Payer: Self-pay | Admitting: Emergency Medicine

## 2013-06-24 DIAGNOSIS — J209 Acute bronchitis, unspecified: Secondary | ICD-10-CM

## 2013-06-24 DIAGNOSIS — R509 Fever, unspecified: Secondary | ICD-10-CM

## 2013-06-24 DIAGNOSIS — J029 Acute pharyngitis, unspecified: Secondary | ICD-10-CM

## 2013-06-24 DIAGNOSIS — J019 Acute sinusitis, unspecified: Secondary | ICD-10-CM

## 2013-06-24 LAB — POCT RAPID STREP A (OFFICE): Rapid Strep A Screen: NEGATIVE

## 2013-06-24 LAB — POCT INFLUENZA A/B
Influenza A, POC: NEGATIVE
Influenza B, POC: NEGATIVE

## 2013-06-24 MED ORDER — PROMETHAZINE HCL 25 MG PO TABS: ORAL_TABLET | ORAL | Status: DC

## 2013-06-24 MED ORDER — LEVOFLOXACIN 500 MG PO TABS: ORAL_TABLET | ORAL | Status: DC

## 2013-06-24 NOTE — ED Notes (Addendum)
Jill Huang complains of fever, chills, sweats, sore throat, body aches, dizziness, nausea and cough for 1 day. She has taken tylenol and Advil for her fever.

## 2013-06-24 NOTE — ED Provider Notes (Signed)
CSN: [id number]     Arrival date & time 06/24/13  1324 History   First MD Initiated Contact with Patient 06/24/13 1344     Chief Complaint  Patient presents with  . Fever    x 1 day  . Generalized Body Aches    x 1 day  . Sore Throat    x 1 day  . Cough    x 1 day   (Consider location/radiation/quality/duration/timing/severity/associated sxs/prior Treatment) HPI URI HISTORY  Jill Huang is a 52 y.o. female who complains of onset of cold symptoms for 4 days.  Have been using over-the-counter treatment which helps a little bit. Jill Huang complains of fever, chills, sweats, sore throat, body aches, dizziness, nausea and cough for 1 day. She has taken tylenol and Advil for her fever, which has helped a little bit   + chills/sweats +  Fever  +  Nasal congestion +  Discolored Post-nasal drainage Mild sinus pain/pressure + sore throat  +  Cough, occasionally productive yellow sputum No wheezing Mild chest congestion No hemoptysis No shortness of breath No pleuritic pain  No itchy/red eyes No earache  No nausea No vomiting No abdominal pain No diarrhea  No skin rashes +  Fatigue No myalgias No headache  Past Medical History  Diagnosis Date  . Lesion of brain    Past Surgical History  Procedure Laterality Date  . Melanoma excision    . Cyst excision     History reviewed. No pertinent family history. History  Substance Use Topics  . Smoking status: Never Smoker   . Smokeless tobacco: Never Used  . Alcohol Use: No   OB History   Grav Para Term Preterm Abortions TAB SAB Ect Mult Living                 Review of Systems  All other systems reviewed and are negative.    Allergies  Amoxicillin; Amoxicillin-pot clavulanate; Cephalexin; Cortisone; and Penicillins  Home Medications   Current Outpatient Rx  Name  Route  Sig  Dispense  Refill  . acetaminophen (TYLENOL) 325 MG tablet   Oral   Take 650 mg by mouth every 6 (six) hours as needed for pain.         .  cetirizine (ZYRTEC) 10 MG tablet   Oral   Take 10 mg by mouth as needed.         . diphenhydrAMINE (BENADRYL) 25 MG tablet   Oral   Take 25 mg by mouth every 6 (six) hours as needed.         Marland Kitchen ibuprofen (ADVIL,MOTRIN) 200 MG tablet   Oral   Take 600 mg by mouth every 6 (six) hours as needed for pain.         . nortriptyline (PAMELOR) 10 MG capsule   Oral   Take 10 mg by mouth at bedtime.         . topiramate (TOPAMAX) 50 MG tablet   Oral   Take 50 mg by mouth 2 (two) times daily.         Marland Kitchen levofloxacin (LEVAQUIN) 500 MG tablet      Take 1 tablet daily for 10 days.   10 tablet   0   . promethazine (PHENERGAN) 25 MG tablet      Take one half or 1 tablet every 6-8 hours as needed for nausea   15 tablet   0    BP 111/74  Pulse 103  Temp(Src) 98.9 F (37.2 C) (  Oral)  Ht 5\' 6"  (1.676 m)  Wt 229 lb (103.874 kg)  BMI 36.98 kg/m2  SpO2 94% Physical Exam  Nursing note and vitals reviewed. Constitutional: She is oriented to person, place, and time. She appears well-developed and well-nourished. No distress.  HENT:  Head: Normocephalic and atraumatic.  Right Ear: Tympanic membrane normal.  Left Ear: Tympanic membrane normal.  Mouth/Throat: Posterior oropharyngeal erythema present. No oropharyngeal exudate, posterior oropharyngeal edema or tonsillar abscesses.  Nose: Boggy turbinates, seromucoid drainage. Mild bilateral maxillary tenderness.  Eyes: Right eye exhibits no discharge. Left eye exhibits no discharge. No scleral icterus.  Neck: Neck supple.  Cardiovascular: Normal rate, regular rhythm and normal heart sounds.   Pulmonary/Chest: No respiratory distress. She has no wheezes. She has rhonchi. She has no rales.  Lymphadenopathy:    She has no cervical adenopathy.  Neurological: She is alert and oriented to person, place, and time.  Skin: Skin is warm and dry.    ED Course  Procedures (including critical care time) Labs Review Labs Reviewed  POCT  RAPID STREP A (OFFICE) - Normal  POCT INFLUENZA A/B - Normal   Imaging Review No results found.  EKG Interpretation     Ventricular Rate:    PR Interval:    QRS Duration:   QT Interval:    QTC Calculation:   R Axis:     Text Interpretation:              MDM   1. Acute bronchitis   2. Fever   3. Sore throat   4. Acute sinusitis    Rapid strep test negative. Influenza A. and B. rapid test negative  Risks, benefits, alternatives discussed. Treatment options discussed. Levaquin 500 mg daily x10 days  At her request for a small amount of promethazine for nausea, promethazine 25 mg. One half or one every 6 hours when necessary nausea. This has worked well in the past for nausea  Other symptomatic care discussed. Followup with PCP if no better one week, sooner if worse or new symptoms  Precautions discussed. Red flags discussed. Questions invited and answered. Patient and husband voiced understanding and agreement.    Lajean Manes, MD 06/24/13 1630

## 2013-07-16 ENCOUNTER — Emergency Department
Admission: EM | Admit: 2013-07-16 | Discharge: 2013-07-16 | Disposition: A | Discharge status: Home / Self Care | Payer: 59 | Source: Home / Self Care | Attending: Family Medicine | Admitting: Family Medicine

## 2013-07-16 ENCOUNTER — Encounter: Payer: Self-pay | Admitting: Emergency Medicine

## 2013-07-16 DIAGNOSIS — J069 Acute upper respiratory infection, unspecified: Secondary | ICD-10-CM

## 2013-07-16 DIAGNOSIS — J029 Acute pharyngitis, unspecified: Secondary | ICD-10-CM

## 2013-07-16 MED ORDER — PREDNISONE 20 MG PO TABS: 20.0000 mg | ORAL_TABLET | Freq: Two times a day (BID) | ORAL | Status: DC

## 2013-07-16 MED ORDER — DOXYCYCLINE HYCLATE 100 MG PO CAPS: 100.0000 mg | ORAL_CAPSULE | Freq: Two times a day (BID) | ORAL | Status: DC

## 2013-07-16 NOTE — ED Notes (Signed)
Sore throat x 4 days 

## 2013-07-16 NOTE — ED Provider Notes (Signed)
CSN: [id number]     Arrival date & time 07/16/13  1917 History   First MD Initiated Contact with Patient 07/16/13 2007     Chief Complaint  Patient presents with  . Sore Throat      HPI Comments: Patient has had nasal congestion for about a week.  Three days ago she developed a sore throat, hoarseness, and a productive cough.  The history is provided by the patient.    Past Medical History  Diagnosis Date  . Lesion of brain    Past Surgical History  Procedure Laterality Date  . Melanoma excision    . Cyst excision     Family History  Problem Relation Age of Onset  . Hyperlipidemia Father   . Hypertension Father   . Aneurysm Father    History  Substance Use Topics  . Smoking status: Former Games developer  . Smokeless tobacco: Never Used  . Alcohol Use: No   OB History   Grav Para Term Preterm Abortions TAB SAB Ect Mult Living                 Review of Systems + sore throat + cough + hoarseness No pleuritic pain No wheezing + nasal congestion + post-nasal drainage No sinus pain/pressure No itchy/red eyes ? earache No hemoptysis No SOB No fever/chills No nausea No vomiting No abdominal pain No diarrhea No urinary symptoms No skin rash + fatigue No myalgias No headache Used OTC meds without relief  Allergies  Amoxicillin; Amoxicillin-pot clavulanate; Cephalexin; Cortisone; and Penicillins  Home Medications   Current Outpatient Rx  Name  Route  Sig  Dispense  Refill  . acetaminophen (TYLENOL) 325 MG tablet   Oral   Take 650 mg by mouth every 6 (six) hours as needed for pain.         . cetirizine (ZYRTEC) 10 MG tablet   Oral   Take 10 mg by mouth as needed.         . diphenhydrAMINE (BENADRYL) 25 MG tablet   Oral   Take 25 mg by mouth every 6 (six) hours as needed.         . doxycycline (VIBRAMYCIN) 100 MG capsule   Oral   Take 1 capsule (100 mg total) by mouth 2 (two) times daily. (Rx void after 07/24/13)   14 capsule   0   . ibuprofen  (ADVIL,MOTRIN) 200 MG tablet   Oral   Take 600 mg by mouth every 6 (six) hours as needed for pain.         . nortriptyline (PAMELOR) 10 MG capsule   Oral   Take 10 mg by mouth at bedtime.         . predniSONE (DELTASONE) 20 MG tablet   Oral   Take 1 tablet (20 mg total) by mouth 2 (two) times daily.   10 tablet   0   . topiramate (TOPAMAX) 50 MG tablet   Oral   Take 50 mg by mouth 2 (two) times daily.          BP 137/87  Pulse 101  Temp(Src) 98.4 F (36.9 C) (Oral)  Ht 5\' 6"  (1.676 m)  Wt 230 lb (104.327 kg)  BMI 37.14 kg/m2  SpO2 97% Physical Exam Nursing notes and Vital Signs reviewed. Appearance:  Patient appears stated age, and in no acute distress.  Patient is obese (BMI 37.1) Eyes:  Pupils are equal, round, and reactive to light and accomodation.  Extraocular movement is intact.  Conjunctivae are not inflamed  Ears:  Canals normal.  Tympanic membranes normal.  Nose:  Mildly congested turbinates.  No sinus tenderness.   Pharynx:  Erythematous and slightly edematous Neck:  Supple.  Tender shotty enlarged posterior nodes are palpated bilaterally  Lungs:  Clear to auscultation.  Breath sounds are equal.  Heart:  Regular rate and rhythm without murmurs, rubs, or gallops.  Abdomen:  Nontender without masses or hepatosplenomegaly.  Bowel sounds are present.  No CVA or flank tenderness.  Extremities:  No edema.  No calf tenderness Skin:  No rash present.   ED Course  Procedures  none    Labs Reviewed  STREP A DNA PROBE  POCT RAPID STREP A (OFFICE) negative         MDM   1. Acute pharyngitis   2. Acute upper respiratory infections of unspecified site; suspect viral URI     Throat culture pending Begin prednisone burst. Take plain Mucinex (guaifenesin) twice daily for cough and congestion.  Increase fluid intake, rest. May use Afrin nasal spray (or generic oxymetazoline) twice daily for about 5 days.  Also recommend using saline nasal spray several times  daily and saline nasal irrigation (AYR is a common brand) Try warm salt water gargles. Stop all antihistamines for now, and other non-prescription cough/cold preparations. May take Delsym Cough Suppressant at bedtime for nighttime cough.  Begin Doxycycline if not improving about one week or if persistent fever develops (Given a prescription to hold, with an expiration date)  Follow-up with family doctor if not improving about10 days.     Lattie Haw, MD 07/18/13 1106

## 2013-07-17 LAB — STREP A DNA PROBE: GASP: NEGATIVE

## 2013-07-19 ENCOUNTER — Telehealth: Payer: Self-pay | Admitting: *Deleted

## 2014-03-17 ENCOUNTER — Encounter: Payer: Self-pay | Admitting: Emergency Medicine

## 2014-03-17 ENCOUNTER — Emergency Department
Admission: EM | Admit: 2014-03-17 | Discharge: 2014-03-17 | Disposition: A | Discharge status: Home / Self Care | Payer: 59 | Source: Home / Self Care | Attending: Emergency Medicine | Admitting: Emergency Medicine

## 2014-03-17 ENCOUNTER — Emergency Department (INDEPENDENT_AMBULATORY_CARE_PROVIDER_SITE_OTHER): Payer: 59

## 2014-03-17 DIAGNOSIS — I872 Venous insufficiency (chronic) (peripheral): Secondary | ICD-10-CM

## 2014-03-17 DIAGNOSIS — S9031XA Contusion of right foot, initial encounter: Secondary | ICD-10-CM

## 2014-03-17 DIAGNOSIS — M7989 Other specified soft tissue disorders: Secondary | ICD-10-CM

## 2014-03-17 DIAGNOSIS — S9030XA Contusion of unspecified foot, initial encounter: Secondary | ICD-10-CM

## 2014-03-17 MED ORDER — HYDROCHLOROTHIAZIDE 12.5 MG PO TABS: 12.5000 mg | ORAL_TABLET | Freq: Every day | ORAL | Status: DC

## 2014-03-17 MED ORDER — HYDROCODONE-ACETAMINOPHEN 5-325 MG PO TABS: 1.0000 | ORAL_TABLET | ORAL | Status: DC | PRN

## 2014-03-17 NOTE — ED Notes (Signed)
Reports having right foot stepped on 4 days ago; still edematous and bruised along base of all toes.

## 2014-03-17 NOTE — ED Provider Notes (Signed)
CSN: [id number]     Arrival date & time 03/17/14  1722 History   First MD Initiated Contact with Patient 03/17/14 1736     Chief Complaint  Patient presents with  . Foot Pain   (Consider location/radiation/quality/duration/timing/severity/associated sxs/prior Treatment) HPI On vacation at the beach 4 days ago, husband accidentally stepped on her right forefoot and since then has worsening swelling and pain and bruising there. Pain is sharp 5/10, sometimes 7/10 with weightbearing.--She tried ibuprofen which helped a little bit Occasionally right foot feels numb, and that is minimal today. Denies having had an open wound.  Second chief complaint is generalized leg swelling bilaterally for the past 3 or 4 days, she feels it is from eating very salty foods when on vacation. Denies chest pain or shortness of breath. Past Medical History  Diagnosis Date  . Lesion of brain    Past Surgical History  Procedure Laterality Date  . Melanoma excision    . Cyst excision     Family History  Problem Relation Age of Onset  . Hyperlipidemia Father   . Hypertension Father   . Aneurysm Father    History  Substance Use Topics  . Smoking status: Former Games developermoker  . Smokeless tobacco: Never Used  . Alcohol Use: No   OB History   Grav Para Term Preterm Abortions TAB SAB Ect Mult Living                 Review of Systems  All other systems reviewed and are negative.   Allergies  Amoxicillin; Amoxicillin-pot clavulanate; Cephalexin; Cortisone; and Penicillins  Home Medications   Prior to Admission medications   Medication Sig Start Date End Date Taking? Authorizing Provider  acetaminophen (TYLENOL) 325 MG tablet Take 650 mg by mouth every 6 (six) hours as needed for pain.    Historical Provider, MD  cetirizine (ZYRTEC) 10 MG tablet Take 10 mg by mouth as needed.    Historical Provider, MD  diphenhydrAMINE (BENADRYL) 25 MG tablet Take 25 mg by mouth every 6 (six) hours as needed.    Historical  Provider, MD  hydrochlorothiazide (HYDRODIURIL) 12.5 MG tablet Take 1 tablet (12.5 mg total) by mouth daily. As needed for fluid retention. (diuretic) 03/17/14   Lajean Manesavid Massey, MD  HYDROcodone-acetaminophen (NORCO/VICODIN) 5-325 MG per tablet Take 1-2 tablets by mouth every 4 (four) hours as needed for severe pain. Take with food. 03/17/14   Lajean Manesavid Massey, MD  ibuprofen (ADVIL,MOTRIN) 200 MG tablet Take 600 mg by mouth every 6 (six) hours as needed for pain.    Historical Provider, MD  nortriptyline (PAMELOR) 10 MG capsule Take 10 mg by mouth at bedtime.    Historical Provider, MD  topiramate (TOPAMAX) 50 MG tablet Take 50 mg by mouth 2 (two) times daily.    Historical Provider, MD   BP 134/84  Pulse 96  Temp(Src) 97.4 F (36.3 C) (Oral)  Resp 16  SpO2 97% Physical Exam  Nursing note and vitals reviewed. Constitutional: She is oriented to person, place, and time. She appears well-developed and well-nourished. No distress.  Uncomfortable from right foot pain. She can weight-bear, but with pain and she limps avoiding excessive weight-bearing right foot  HENT:  Head: Normocephalic and atraumatic.  Mouth/Throat: Oropharynx is clear and moist.  Eyes: Conjunctivae and EOM are normal. Pupils are equal, round, and reactive to light. No scleral icterus.  Neck: Normal range of motion. Neck supple. No JVD present.  Cardiovascular: Normal rate, regular rhythm and normal heart sounds.  No murmur heard. Pulmonary/Chest: Effort normal and breath sounds normal.  Abdominal: She exhibits no distension.  Musculoskeletal: Normal range of motion.       Right ankle: No tenderness. Achilles tendon normal.       Right foot: She exhibits tenderness (Diffusely, forefoot), bony tenderness and swelling. She exhibits normal range of motion, normal capillary refill, no deformity and no laceration.       Left foot: She exhibits normal range of motion, no tenderness, normal capillary refill, no deformity and no  laceration.       Feet:  Right foot: Very swollen and tender with mild bruising right forefoot, especially second metatarsal. Tendons intact. Toes and toenails intact. Capillary refill normal. Range of motion normal. Neurovascular distally intact  Both lower legs have trace pretibial edema. No cords or tenderness or heat or sign of DVT. Homans sign negative bilaterally.  Neurological: She is alert and oriented to person, place, and time. She has normal strength. No cranial nerve deficit or sensory deficit.  Skin: Skin is warm. No rash noted.  Psychiatric: She has a normal mood and affect.   O2 saturation 97% on room air ED Course  Procedures (including critical care time) Labs Review Labs Reviewed - No data to display  Imaging Review Dg Foot Complete Right  03/17/2014   CLINICAL DATA:  Numbness along the top of the foot with tingling in the toes. Recent injury.  EXAM: RIGHT FOOT COMPLETE - 3+ VIEW  COMPARISON:  None.  FINDINGS: No acute osseous or joint abnormality. Forefoot soft tissue swelling. Plantar calcaneal spur.  IMPRESSION: Forefoot soft tissue swelling without acute osseous or joint abnormality.   Electronically Signed   By: Leanna Battles M.D.   On: 03/17/2014 17:56     MDM   1. Contusion of right foot, initial encounter   2. Venous insufficiency of both lower extremities    clinically, no evidence of DVT. X-ray right foot shows no acute bony abnormality. No fracture or dislocation. There is forefoot soft tissue swelling.  Treatment options discussed, as well as risks, benefits, alternatives. Patient voiced understanding and agreement with the following plans: For the contusion, patient fitted with postop shoe right foot. Discussed elevation, rest, ACE, heat. Other symptomatic care. Explained the possible ibuprofen could contribute to leg edema, so for mild to moderate pain, try Tylenol. For moderate to severe pain, instead use the Vicodin that I prescribed.  For  exacerbation of the bilateral venous insufficiency, discussed weight loss, decreased salt intake. At her request for diuretic, I prescribed low-dose HCTZ 12.5 mg daily. She understands this is for short-term use and she would need to followup with PCP to reevaluate leg edema if it persisted.  Follow-up with your primary care doctor in 5-7 days if not improving, or sooner if symptoms become worse. Precautions discussed. Red flags discussed. Questions invited and answered. Patient voiced understanding and agreement.      Lajean Manes, MD 03/17/14 [phone]

## 2014-08-26 ENCOUNTER — Encounter: Payer: Self-pay | Admitting: *Deleted

## 2014-08-26 ENCOUNTER — Emergency Department
Admission: EM | Admit: 2014-08-26 | Discharge: 2014-08-26 | Disposition: A | Discharge status: Home / Self Care | Payer: 59 | Source: Home / Self Care | Attending: Emergency Medicine | Admitting: Emergency Medicine

## 2014-08-26 DIAGNOSIS — J069 Acute upper respiratory infection, unspecified: Secondary | ICD-10-CM

## 2014-08-26 DIAGNOSIS — J029 Acute pharyngitis, unspecified: Secondary | ICD-10-CM

## 2014-08-26 HISTORY — DX: Essential (primary) hypertension: I10

## 2014-08-26 MED ORDER — CLINDAMYCIN HCL 300 MG PO CAPS: 300.0000 mg | ORAL_CAPSULE | Freq: Three times a day (TID) | ORAL | Status: DC

## 2014-08-26 MED ORDER — PREDNISONE 20 MG PO TABS: 20.0000 mg | ORAL_TABLET | Freq: Two times a day (BID) | ORAL | Status: DC

## 2014-08-26 NOTE — ED Notes (Signed)
Pt c/o sore throat, cough, bad taste in her mouth, and hoarseness x 2 days. Son with positive strep. Denies fever.

## 2014-08-26 NOTE — ED Provider Notes (Signed)
CSN: [id number]     Arrival date & time 08/26/14  1811 History   First MD Initiated Contact with Patient 08/26/14 1837     Chief Complaint  Patient presents with  . Sore Throat  . Cough    HPI Pt c/o sore throat, cough, bad taste in her mouth, and hoarseness x 3 days.  Progressively worsening .She states son recently with positive strep cx (even though his rapid strep test was neg)  Jill Huang is a 53 y.o. female who complains of onset of cold symptoms for several days.  Have been using over-the-counter treatment which helps a little bit.  No chills/sweats +  Low-grade Fever  +  Nasal congestion +  Minimal Discolored Post-nasal drainage No sinus pain/pressure +moderate to severe sore throat  +  Cough, nonproductive. Occasional wheezing--she requests a prescription for prednisone as that has helped when she has any URI with wheezing. Denies side effects on prednisone in the past. Mild chest congestion No hemoptysis No shortness of breath No pleuritic pain  No itchy/red eyes No earache  Had mild nausea, which resolved.  She is tolerating by mouth No vomiting No abdominal pain No diarrhea  No skin rashes +  Fatigue No myalgias No headache   Past Medical History  Diagnosis Date  . Lesion of brain   . Hypertension    Past Surgical History  Procedure Laterality Date  . Melanoma excision    . Cyst excision     Family History  Problem Relation Age of Onset  . Hyperlipidemia Father   . Hypertension Father   . Aneurysm Father    History  Substance Use Topics  . Smoking status: Former Games developermoker  . Smokeless tobacco: Never Used  . Alcohol Use: No   OB History    No data available     Review of Systems  All other systems reviewed and are negative.  She denies chance of pregnancy.  Allergies  Amoxicillin; Amoxicillin-pot clavulanate; Cephalexin; Cortisone; and Penicillins  The carefully reviewed her drug allergies, and she states she's taken prednisone in the  past without any significant side effects Home Medications   Prior to Admission medications   Medication Sig Start Date End Date Taking? Authorizing Provider  Ascorbic Acid (VITAMIN C) 100 MG tablet Take 100 mg by mouth daily.   Yes Historical Provider, MD  b complex vitamins tablet Take 1 tablet by mouth daily.   Yes Historical Provider, MD  cetirizine (ZYRTEC) 10 MG tablet Take 10 mg by mouth as needed.   Yes Historical Provider, MD  MULTIPLE VITAMIN PO Take by mouth.   Yes Historical Provider, MD  nortriptyline (PAMELOR) 10 MG capsule Take 10 mg by mouth at bedtime.   Yes Historical Provider, MD  topiramate (TOPAMAX) 50 MG tablet Take 50 mg by mouth 2 (two) times daily.   Yes Historical Provider, MD  acetaminophen (TYLENOL) 325 MG tablet Take 650 mg by mouth every 6 (six) hours as needed for pain.    Historical Provider, MD  clindamycin (CLEOCIN) 300 MG capsule Take 1 capsule (300 mg total) by mouth 3 (three) times daily. 08/26/14   Lajean Manesavid Massey, MD  diphenhydrAMINE (BENADRYL) 25 MG tablet Take 25 mg by mouth every 6 (six) hours as needed.    Historical Provider, MD  ibuprofen (ADVIL,MOTRIN) 200 MG tablet Take 600 mg by mouth every 6 (six) hours as needed for pain.    Historical Provider, MD  predniSONE (DELTASONE) 20 MG tablet Take 1 tablet (20 mg total)  by mouth 2 (two) times daily with a meal. X 7 days 08/26/14   Lajean Manes, MD   BP 146/86 mmHg  Pulse 102  Temp(Src) 98.4 F (36.9 C) (Oral)  Resp 16  Ht 5\' 6"  (1.676 m)  Wt 239 lb (108.41 kg)  BMI 38.59 kg/m2  SpO2 97% Physical Exam  Constitutional: She is oriented to person, place, and time. She appears well-developed and well-nourished. She is cooperative.  Non-toxic appearance. No distress.  HENT:  Head: Normocephalic and atraumatic.  Right Ear: Tympanic membrane, external ear and ear canal normal.  Left Ear: Tympanic membrane, external ear and ear canal normal.  Nose: Nose normal. Right sinus exhibits no maxillary sinus  tenderness and no frontal sinus tenderness. Left sinus exhibits no maxillary sinus tenderness and no frontal sinus tenderness.  Mouth/Throat: Mucous membranes are normal. Posterior oropharyngeal erythema present. No oropharyngeal exudate or posterior oropharyngeal edema.  Eyes: Conjunctivae are normal. No scleral icterus.  Neck: Neck supple.  Cardiovascular: Normal rate, regular rhythm and normal heart sounds.   No murmur heard. Pulmonary/Chest: Effort normal and breath sounds normal. No stridor. No respiratory distress. She has no wheezes. She has no rales.  Lungs were clear except scant diffuse anterior late expiratory wheezes on forced expiration only  Abdominal: She exhibits no distension.  Musculoskeletal: She exhibits no edema or tenderness.  Lymphadenopathy:    She has cervical adenopathy.       Right cervical: Superficial cervical adenopathy present. No deep cervical and no posterior cervical adenopathy present.      Left cervical: Superficial cervical adenopathy present. No deep cervical and no posterior cervical adenopathy present.  Neurological: She is alert and oriented to person, place, and time. No cranial nerve deficit.  Skin: Skin is warm and dry. No rash noted.  Psychiatric: She has a normal mood and affect.  Nursing note and vitals reviewed.  She is mild to moderately hoarse ED Course  Procedures (including critical care time) Labs Review Labs Reviewed - No data to display  Imaging Review No results found.   MDM   1. Acute pharyngitis, unspecified pharyngitis type   2. Acute upper respiratory infection    Rapid strep test negative. I am still very suspicious that she has strep, given her sons history of recent positive strep culture. Also, she has minimal bronchospasm with URI. Treatment options discussed, as well as risks, benefits, alternatives. She declined any IM medications today. She declined any nebulizer/bronchodilator treatment. We discussed  antibiotic choices at length. :She states she has done well with clindamycin and doxycycline in the past without side effects. She has multiple drug allergies including Keflex, Augmentin, penicillin. She has taken azithromycin in the past, and she had some intolerance to this although no true allergy to azithromycin. Patient voiced understanding and agreement with the following plans: New Prescriptions   CLINDAMYCIN (CLEOCIN) 300 MG CAPSULE    Take 1 capsule (300 mg total) by mouth 3 (three) times daily.   PREDNISONE (DELTASONE) 20 MG TABLET    Take 1 tablet (20 mg total) by mouth 2 (two) times daily with a meal. X 7 days   The clindamycin has excellent strep coverage and other antibacterial coverage. Strep culture sent. A written prescription for prednisone, advised patient to hold onto this for 1-2 days and fill if respiratory symptoms not improving. She declined any cough medication, but prefers to use OTC decongestant, Mucinex and cough med. Other symptomatic care discussed. Over 30 minutes spent, greater than 50% of the time  spent for counseling and coordination of care. Follow-up with your primary care doctor in 5-7 days if not improving, or sooner if symptoms become worse. Precautions discussed. Red flags discussed. Questions invited and answered. Patient voiced understanding and agreement.       Lajean Manesavid Massey, MD 08/26/14 [phone]

## 2014-08-28 ENCOUNTER — Telehealth: Payer: Self-pay | Admitting: *Deleted

## 2014-09-02 ENCOUNTER — Emergency Department (HOSPITAL_BASED_OUTPATIENT_CLINIC_OR_DEPARTMENT_OTHER)
Admission: EM | Admit: 2014-09-02 | Discharge: 2014-09-03 | Disposition: A | Discharge status: Home / Self Care | Payer: 59 | Attending: Emergency Medicine | Admitting: Emergency Medicine

## 2014-09-02 ENCOUNTER — Other Ambulatory Visit: Payer: Self-pay

## 2014-09-02 ENCOUNTER — Encounter (HOSPITAL_BASED_OUTPATIENT_CLINIC_OR_DEPARTMENT_OTHER): Payer: Self-pay | Admitting: *Deleted

## 2014-09-02 ENCOUNTER — Emergency Department
Admission: EM | Admit: 2014-09-02 | Discharge: 2014-09-02 | Disposition: A | Discharge status: Nursing Home (Includes ICF) | Payer: 59 | Source: Home / Self Care | Attending: Emergency Medicine | Admitting: Emergency Medicine

## 2014-09-02 ENCOUNTER — Encounter: Payer: Self-pay | Admitting: Emergency Medicine

## 2014-09-02 ENCOUNTER — Emergency Department (HOSPITAL_BASED_OUTPATIENT_CLINIC_OR_DEPARTMENT_OTHER): Payer: 59

## 2014-09-02 DIAGNOSIS — Z79899 Other long term (current) drug therapy: Secondary | ICD-10-CM | POA: Insufficient documentation

## 2014-09-02 DIAGNOSIS — Z880 Allergy status to penicillin: Secondary | ICD-10-CM | POA: Diagnosis not present

## 2014-09-02 DIAGNOSIS — Z8669 Personal history of other diseases of the nervous system and sense organs: Secondary | ICD-10-CM | POA: Diagnosis not present

## 2014-09-02 DIAGNOSIS — R079 Chest pain, unspecified: Secondary | ICD-10-CM

## 2014-09-02 DIAGNOSIS — Z87891 Personal history of nicotine dependence: Secondary | ICD-10-CM | POA: Insufficient documentation

## 2014-09-02 DIAGNOSIS — I10 Essential (primary) hypertension: Secondary | ICD-10-CM | POA: Insufficient documentation

## 2014-09-02 DIAGNOSIS — Z8709 Personal history of other diseases of the respiratory system: Secondary | ICD-10-CM | POA: Insufficient documentation

## 2014-09-02 HISTORY — DX: Pulmonary fibrosis, unspecified: J84.10

## 2014-09-02 LAB — CBC WITH DIFFERENTIAL/PLATELET
BASOS ABS: 0 10*3/uL (ref 0.0–0.1)
Basophils Relative: 0 % (ref 0–1)
EOS PCT: 3 % (ref 0–5)
Eosinophils Absolute: 0.5 10*3/uL (ref 0.0–0.7)
HCT: 40.3 % (ref 36.0–46.0)
HEMOGLOBIN: 13.3 g/dL (ref 12.0–15.0)
Lymphocytes Relative: 33 % (ref 12–46)
Lymphs Abs: 5.3 10*3/uL — ABNORMAL HIGH (ref 0.7–4.0)
MCH: 30.3 pg (ref 26.0–34.0)
MCHC: 33 g/dL (ref 30.0–36.0)
MCV: 91.8 fL (ref 78.0–100.0)
MONO ABS: 0.8 10*3/uL (ref 0.1–1.0)
MONOS PCT: 5 % (ref 3–12)
NEUTROS PCT: 59 % (ref 43–77)
Neutro Abs: 9.5 10*3/uL — ABNORMAL HIGH (ref 1.7–7.7)
PLATELETS: 488 10*3/uL — AB (ref 150–400)
RBC: 4.39 MIL/uL (ref 3.87–5.11)
RDW: 13.5 % (ref 11.5–15.5)
WBC: 16.1 10*3/uL — AB (ref 4.0–10.5)

## 2014-09-02 LAB — COMPREHENSIVE METABOLIC PANEL
ALK PHOS: 92 U/L (ref 39–117)
ALT: 15 U/L (ref 0–35)
ANION GAP: 8 (ref 5–15)
AST: 16 U/L (ref 0–37)
Albumin: 3.9 g/dL (ref 3.5–5.2)
BUN: 18 mg/dL (ref 6–23)
CHLORIDE: 104 meq/L (ref 96–112)
CO2: 27 mmol/L (ref 19–32)
CREATININE: 0.84 mg/dL (ref 0.50–1.10)
Calcium: 8.8 mg/dL (ref 8.4–10.5)
GFR calc non Af Amer: 79 mL/min — ABNORMAL LOW (ref >90)
GLUCOSE: 107 mg/dL — AB (ref 70–99)
Potassium: 3.6 mmol/L (ref 3.5–5.1)
Sodium: 139 mmol/L (ref 135–145)
Total Bilirubin: 0.5 mg/dL (ref 0.3–1.2)
Total Protein: 7.1 g/dL (ref 6.0–8.3)

## 2014-09-02 LAB — LIPASE, BLOOD: Lipase: 23 U/L (ref 11–59)

## 2014-09-02 LAB — TROPONIN I

## 2014-09-02 MED ORDER — GI COCKTAIL ~~LOC~~
30.0000 mL | Freq: Once | ORAL | Status: AC
  Administered 2014-09-02: 30 mL via ORAL
  Filled 2014-09-02: qty 30

## 2014-09-02 NOTE — ED Provider Notes (Signed)
CSN: [id number]     Arrival date & time 09/02/14  2110 History   First MD Initiated Contact with Patient 09/02/14 2212     Chief Complaint  Patient presents with  . Chest Pain     (Consider location/radiation/quality/duration/timing/severity/associated sxs/prior Treatment) Patient is a 54 y.o. female presenting with chest pain.  Chest Pain Pain location:  Substernal area and L chest Pain quality: burning and tightness   Pain radiates to:  Does not radiate Pain radiates to the back: no   Pain severity:  Mild Onset quality:  Gradual Duration:  1 day Timing:  Constant Progression:  Unchanged Chronicity:  New Context comment:  On clindamycin/prednisone for sore throat Relieved by:  Nothing Worsened by:  Nothing tried Ineffective treatments:  None tried Associated symptoms: no abdominal pain, no cough, no fever, no nausea, no shortness of breath and not vomiting     Past Medical History  Diagnosis Date  . Lesion of brain   . Hypertension   . Lung granuloma     left lung   Past Surgical History  Procedure Laterality Date  . Melanoma excision    . Cyst excision     Family History  Problem Relation Age of Onset  . Hyperlipidemia Father   . Hypertension Father   . Aneurysm Father    History  Substance Use Topics  . Smoking status: Former Games developer  . Smokeless tobacco: Never Used  . Alcohol Use: No   OB History    No data available     Review of Systems  Constitutional: Negative for fever.  Respiratory: Negative for cough and shortness of breath.   Cardiovascular: Positive for chest pain.  Gastrointestinal: Negative for nausea, vomiting and abdominal pain.  All other systems reviewed and are negative.     Allergies  Amoxicillin; Amoxicillin-pot clavulanate; Cephalexin; Cortisone; and Penicillins  Home Medications   Prior to Admission medications   Medication Sig Start Date End Date Taking? Authorizing Provider  acetaminophen (TYLENOL) 325 MG tablet Take  650 mg by mouth every 6 (six) hours as needed for pain.    Historical Provider, MD  Ascorbic Acid (VITAMIN C) 100 MG tablet Take 100 mg by mouth daily.    Historical Provider, MD  b complex vitamins tablet Take 1 tablet by mouth daily.    Historical Provider, MD  cetirizine (ZYRTEC) 10 MG tablet Take 10 mg by mouth as needed.    Historical Provider, MD  diphenhydrAMINE (BENADRYL) 25 MG tablet Take 25 mg by mouth every 6 (six) hours as needed.    Historical Provider, MD  ibuprofen (ADVIL,MOTRIN) 200 MG tablet Take 600 mg by mouth every 6 (six) hours as needed for pain.    Historical Provider, MD  MULTIPLE VITAMIN PO Take by mouth.    Historical Provider, MD  nortriptyline (PAMELOR) 10 MG capsule Take 10 mg by mouth at bedtime.    Historical Provider, MD  topiramate (TOPAMAX) 50 MG tablet Take 50 mg by mouth 2 (two) times daily.    Historical Provider, MD   BP 158/97 mmHg  Pulse 90  Temp(Src) 98.5 F (36.9 C) (Oral)  Resp 22  Ht  (1.676 m)  Wt 229 lb (103.874 kg)  BMI 36.98 kg/m2  SpO2 100% Physical Exam  Constitutional: She is oriented to person, place, and time. She appears well-developed and well-nourished.  HENT:  Head: Normocephalic and atraumatic.  Right Ear: External ear normal.  Left Ear: External ear normal.  Eyes: Conjunctivae and EOM are  normal. Pupils are equal, round, and reactive to light.  Neck: Normal range of motion. Neck supple.  Cardiovascular: Normal rate, regular rhythm, normal heart sounds and intact distal pulses.   Pulmonary/Chest: Effort normal and breath sounds normal.  Abdominal: Soft. Bowel sounds are normal. There is no tenderness.  Musculoskeletal: Normal range of motion.  Neurological: She is alert and oriented to person, place, and time.  Skin: Skin is warm and dry.  Vitals reviewed.   ED Course  Procedures (including critical care time) Labs Review Labs Reviewed  CBC WITH DIFFERENTIAL - Abnormal; Notable for the following:    WBC 16.1 (*)     Platelets 488 (*)    Neutro Abs 9.5 (*)    Lymphs Abs 5.3 (*)    All other components within normal limits  COMPREHENSIVE METABOLIC PANEL - Abnormal; Notable for the following:    Glucose, Bld 107 (*)    GFR calc non Af Amer 79 (*)    All other components within normal limits  TROPONIN I  LIPASE, BLOOD    Imaging Review Dg Chest 2 View  09/02/2014   CLINICAL DATA:  Acute onset of intermittent substernal and epigastric chest tightness. Initial encounter.  EXAM: CHEST  2 VIEW  COMPARISON:  None.  FINDINGS: The lungs are well-aerated and clear. There is no evidence of focal opacification, pleural effusion or pneumothorax.  The heart is normal in size; the mediastinal contour is within normal limits. No acute osseous abnormalities are seen.  IMPRESSION: No acute cardiopulmonary process seen.   Electronically Signed   By: Roanna Raider M.D.   On: 09/02/2014 23:00     EKG Interpretation None       Date: 09/02/2014  Rate: 87  Rhythm: normal sinus rhythm  QRS Axis: normal  Intervals: normal  ST/T Wave abnormalities: nonspecific ST/T changes  Conduction Disutrbances:none  Narrative Interpretation: NSR without acute ST pathology  Old EKG Reviewed: unchanged   MDM   Final diagnoses:  Chest pain    54 y.o. female with pertinent PMH of unspecified brain lesion with ongoing wu, HTN presents with epigastric chest pain as described above. Symptoms intermittent, lasting for approximately 5 minutes at a time. She describes pressure. Symptoms worse when laying flat. Symptoms nonexertional.  Last symptoms present greater than 6 hours prior to arrival. History atypical for ACS. Exam as above unremarkable. Given GI cocktail. Troponin unremarkable. EKG without acute pathology.  Likely reflux however patient was given strict return precautions for chest pain. She was also given referral to cardiology for follow-up. Discharged home in stable condition..    I have reviewed all laboratory and  imaging studies if ordered as above  1. Chest pain         Mirian Mo, MD 09/02/14 [phone]

## 2014-09-02 NOTE — Discharge Instructions (Signed)

## 2014-09-02 NOTE — ED Notes (Signed)
Was in [facility] on 08-26-14 and treated for URI; discontinued prednisone yesterday because it kept her from sleeping; still on antibiotic. States today she had 2 episodes of substernal pain and denies GI symptoms or shortness of breath. Is concerned about MI with family history.

## 2014-09-02 NOTE — ED Provider Notes (Signed)
CSN: [id number]     Arrival date & time 09/02/14  1928 History   First MD Initiated Contact with Patient 09/02/14 1942     Chief Complaint  Patient presents with  . Chest Pain   (Consider location/radiation/quality/duration/timing/severity/associated sxs/prior Treatment) Patient is a 54 y.o. female presenting with chest pain. The history is provided by the patient. No language interpreter was used.  Chest Pain Pain location:  Substernal area Pain quality: aching and tightness   Pain radiates to:  Does not radiate Pain radiates to the back: no   Pain severity:  Severe Onset quality:  Sudden Duration:  1 minute Timing:  Intermittent Progression:  Waxing and waning Chronicity:  New Context: not breathing   Relieved by:  Antacids and aspirin Worsened by:  Nothing tried Ineffective treatments:  None tried Associated symptoms: headache and nausea   Associated symptoms: no abdominal pain and no fever   Risk factors: hypertension   Pt has been on prednisone and clindamycin.  Pt took an aspirin and pepcid and felt better.  Pt reports she took a nap but woke up with the same pain in chest.  Pt reports nausea, has not been able to eat today.   Pt is a nonsmoker but smoked for 20 plus years.  Pt has borderline htn.  Pt describes fullness in her chest. Grabbing pain.   Past Medical History  Diagnosis Date  . Lesion of brain   . Hypertension    Past Surgical History  Procedure Laterality Date  . Melanoma excision    . Cyst excision     Family History  Problem Relation Age of Onset  . Hyperlipidemia Father   . Hypertension Father   . Aneurysm Father    History  Substance Use Topics  . Smoking status: Former Games developer  . Smokeless tobacco: Never Used  . Alcohol Use: No   OB History    No data available     Review of Systems  Constitutional: Negative for fever.  Cardiovascular: Positive for chest pain.  Gastrointestinal: Positive for nausea. Negative for abdominal pain.   Neurological: Positive for headaches.  All other systems reviewed and are negative.   Allergies  Amoxicillin; Amoxicillin-pot clavulanate; Cephalexin; Cortisone; and Penicillins  Home Medications   Prior to Admission medications   Medication Sig Start Date End Date Taking? Authorizing Provider  acetaminophen (TYLENOL) 325 MG tablet Take 650 mg by mouth every 6 (six) hours as needed for pain.    Historical Provider, MD  Ascorbic Acid (VITAMIN C) 100 MG tablet Take 100 mg by mouth daily.    Historical Provider, MD  b complex vitamins tablet Take 1 tablet by mouth daily.    Historical Provider, MD  cetirizine (ZYRTEC) 10 MG tablet Take 10 mg by mouth as needed.    Historical Provider, MD  clindamycin (CLEOCIN) 300 MG capsule Take 1 capsule (300 mg total) by mouth 3 (three) times daily. 08/26/14   Lajean Manes, MD  diphenhydrAMINE (BENADRYL) 25 MG tablet Take 25 mg by mouth every 6 (six) hours as needed.    Historical Provider, MD  ibuprofen (ADVIL,MOTRIN) 200 MG tablet Take 600 mg by mouth every 6 (six) hours as needed for pain.    Historical Provider, MD  MULTIPLE VITAMIN PO Take by mouth.    Historical Provider, MD  nortriptyline (PAMELOR) 10 MG capsule Take 10 mg by mouth at bedtime.    Historical Provider, MD  predniSONE (DELTASONE) 20 MG tablet Take 1 tablet (20 mg total) by  mouth 2 (two) times daily with a meal. X 7 days 08/26/14   Lajean Manes, MD  topiramate (TOPAMAX) 50 MG tablet Take 50 mg by mouth 2 (two) times daily.    Historical Provider, MD   BP 145/91 mmHg  Pulse 88  Temp(Src) 97.8 F (36.6 C) (Oral)  Resp 18  Ht  (1.676 m)  Wt 239 lb (108.41 kg)  BMI 38.59 kg/m2  SpO2 99% Physical Exam  Constitutional: She is oriented to person, place, and time. She appears well-developed and well-nourished.  HENT:  Head: Normocephalic and atraumatic.  Mouth/Throat: Oropharynx is clear and moist.  Eyes: Conjunctivae and EOM are normal. Pupils are equal, round, and reactive to  light.  Neck: Normal range of motion.  Cardiovascular: Normal rate, regular rhythm and intact distal pulses.   Pulmonary/Chest: Effort normal and breath sounds normal.  Abdominal: Soft. She exhibits no distension.  Musculoskeletal: Normal range of motion.  Neurological: She is alert and oriented to person, place, and time.  Skin: Skin is warm.  Psychiatric: She has a normal mood and affect.  Nursing note and vitals reviewed.   ED Course  Procedures (including critical care time) Labs Review Labs Reviewed - No data to display  Imaging Review No results found.   MDM     1. Chest pain, unspecified chest pain type    EKG no stemi,  No st elevation.   I counseled pt I think symptoms are probably Gi related to prednisone and clindamycin however I feel she needs a troponin and lab evaluation.   Pt has been a [facility] pt and is switching to [facility].  Pt desires [facility] for her evaluation. I spoke with [facility] De[facility] at [facility] regarding my concerns.    Lonia Skinner [place], PA-C 09/02/14 2027

## 2014-09-02 NOTE — ED Notes (Signed)
Pt a/o x4 no Distress or chest pain at the moment. Only headache. Pt reports nausea due to the fact she has not eaten.

## 2014-09-02 NOTE — ED Notes (Signed)
Pt reports intermittent substernal and epigastric chest tightness since 1030 this morning.  Pt went to [facility], they referred her here for blood test.  Denies pain at this time.  Reports indigestion when eating today, denies SOB, denies vomiting reports nausea.

## 2014-10-19 ENCOUNTER — Emergency Department
Admission: EM | Admit: 2014-10-19 | Discharge: 2014-10-19 | Disposition: A | Discharge status: Home / Self Care | Payer: 59 | Source: Home / Self Care | Attending: Family Medicine | Admitting: Family Medicine

## 2014-10-19 ENCOUNTER — Emergency Department (INDEPENDENT_AMBULATORY_CARE_PROVIDER_SITE_OTHER): Payer: 59

## 2014-10-19 ENCOUNTER — Encounter: Payer: Self-pay | Admitting: Emergency Medicine

## 2014-10-19 DIAGNOSIS — M25531 Pain in right wrist: Secondary | ICD-10-CM

## 2014-10-19 DIAGNOSIS — S60211A Contusion of right wrist, initial encounter: Secondary | ICD-10-CM

## 2014-10-19 MED ORDER — DICLOFENAC SODIUM 75 MG PO TBEC: 75.0000 mg | DELAYED_RELEASE_TABLET | Freq: Two times a day (BID) | ORAL | Status: AC | PRN

## 2014-10-19 NOTE — ED Notes (Signed)
Reports banging right wrist on something twice in past 2 weeks; now has pain upon rotating and lifting; was edematous but now only bruised.

## 2014-10-19 NOTE — Discharge Instructions (Signed)
Thank you for coming in today. Follow up with Dr. Karie Schwalbe if not better in 2 weeks.  Come back as needed.  Take diclofenac twice daily for pain as needed If you start having stomach upset with this medication take over-the-counter Zantac or Prilosec or Pepcid.   Contusion A contusion is the result of an injury to the skin and underlying tissues and is usually caused by direct trauma. The injury results in the appearance of a bruise on the skin overlying the injured tissues. Contusions cause rupture and bleeding of the small capillaries and blood vessels and affect function, because the bleeding infiltrates muscles, tendons, nerves, or other soft tissues.  SYMPTOMS   Swelling and often a hard lump in the injured area, either superficial or deep.  Pain and tenderness over the area of the contusion.  Feeling of firmness when pressure is exerted over the contusion.  Discoloration under the skin, beginning with redness and progressing to the characteristic "black and blue" bruise. CAUSES  A contusion is typically the result of direct trauma. This is often by a blunt object.  RISK INCREASES WITH:  Sports that have a high likelihood of trauma (football, boxing, ice hockey, soccer, field hockey, martial arts, basketball, and baseball).  Sports that make falling from a height likely (high-jumping, pole-vaulting, skating, or gymnastics).  Any bleeding disorder (hemophilia) or taking medications that affect clotting (aspirin, nonsteroidal anti-inflammatory medications, or warfarin [Coumadin]).  Inadequate protection of exposed areas during contact sports. PREVENTION  Maintain physical fitness:  Joint and muscle flexibility.  Strength and endurance.  Coordination.  Wear proper protective equipment. Make sure it fits correctly. PROGNOSIS  Contusions typically heal without any complications. Healing time varies with the severity of injury and intake of medications that affect clotting.  Contusions usually heal in 1 to 4 weeks. RELATED COMPLICATIONS   Damage to nearby nerves or blood vessels, causing numbness, coldness, or paleness.  Compartment syndrome.  Bleeding into the soft tissues that leads to disability.  Infiltrative-type bleeding, leading to the calcification and impaired function of the injured muscle (rare).  Prolonged healing time if usual activities are resumed too soon.  Infection if the skin over the injury site is broken.  Fracture of the bone underlying the contusion.  Stiffness in the joint where the injured muscle crosses. TREATMENT  Treatment initially consists of resting the injured area as well as medication and ice to reduce inflammation. The use of a compression bandage may also be helpful in minimizing inflammation. As pain diminishes and movement is tolerated, the joint where the affected muscle crosses should be moved to prevent stiffness and the shortening (contracture) of the joint. Movement of the joint should begin as soon as possible. It is also important to work on maintaining strength within the affected muscles. Occasionally, extra padding over the area of contusion may be recommended before returning to sports, particularly if re-injury is likely.  MEDICATION   If pain relief is necessary these medications are often recommended:  Nonsteroidal anti-inflammatory medications, such as aspirin and ibuprofen.  Other minor pain relievers, such as acetaminophen, are often recommended.  Prescription pain relievers may be given by your caregiver. Use only as directed and only as much as you need. HEAT AND COLD  Cold treatment (icing) relieves pain and reduces inflammation. Cold treatment should be applied for 10 to 15 minutes every 2 to 3 hours for inflammation and pain and immediately after any activity that aggravates your symptoms. Use ice packs or an ice massage. (To  do an ice massage fill a large styrofoam cup with water and freeze.  Tear a small amount of foam from the top so ice protrudes. Massage ice firmly over the injured area in a circle about the size of a softball.)  Heat treatment may be used prior to performing the stretching and strengthening activities prescribed by your caregiver, physical therapist, or athletic trainer. Use a heat pack or a warm soak. SEEK MEDICAL CARE IF:   Symptoms get worse or do not improve despite treatment in a few days.  You have difficulty moving a joint.  Any extremity becomes extremely painful, numb, pale, or cool (This is an emergency!).  Medication produces any side effects (bleeding, upset stomach, or allergic reaction).  Signs of infection (drainage from skin, headache, muscle aches, dizziness, fever, or general ill feeling) occur if skin was broken. Document Released: 08/16/2005 Document Revised: 11/08/2011 Document Reviewed: 11/28/2008 [facility] Patient Information 2015 [place], [place]. This information is not intended to replace advice given to you by your health care provider. Make sure you discuss any questions you have with your health care provider.

## 2014-10-19 NOTE — ED Provider Notes (Signed)
Jill Huang is a 54 y.o. female who presents to Urgent Care today for wrist pain. Patient notes a 2 week history of worsening right-sided wrist pain. She thinks she may have struck her dorsal aspect of her wrist against object at home but is not sure. The pain worsened recently. No radiating pain weakness or numbness. Pain is worse with activity and better with rest.  Patient is a Horticulturist, commercialpaino teacher and notes the pain is worse with normal activities for her.   Past Medical History  Diagnosis Date  . Lesion of brain   . Hypertension   . Lung granuloma     left lung   Past Surgical History  Procedure Laterality Date  . Melanoma excision    . Cyst excision     History  Substance Use Topics  . Smoking status: Former Games developermoker  . Smokeless tobacco: Never Used  . Alcohol Use: No   ROS as above Medications: No current facility-administered medications for this encounter.   Current Outpatient Prescriptions  Medication Sig Dispense Refill  . acetaminophen (TYLENOL) 325 MG tablet Take 650 mg by mouth every 6 (six) hours as needed for pain.    . Ascorbic Acid (VITAMIN C) 100 MG tablet Take 100 mg by mouth daily.    Marland Kitchen. b complex vitamins tablet Take 1 tablet by mouth daily.    . cetirizine (ZYRTEC) 10 MG tablet Take 10 mg by mouth as needed.    . diphenhydrAMINE (BENADRYL) 25 MG tablet Take 25 mg by mouth every 6 (six) hours as needed.    Marland Kitchen. ibuprofen (ADVIL,MOTRIN) 200 MG tablet Take 600 mg by mouth every 6 (six) hours as needed for pain.    Marland Kitchen. MULTIPLE VITAMIN PO Take by mouth.    . nortriptyline (PAMELOR) 10 MG capsule Take 10 mg by mouth at bedtime.    . topiramate (TOPAMAX) 50 MG tablet Take 50 mg by mouth 2 (two) times daily.    . diclofenac (VOLTAREN) 75 MG EC tablet Take 1 tablet (75 mg total) by mouth 2 (two) times daily as needed. 30 tablet 0   Allergies  Allergen Reactions  . Amoxicillin     REACTION: Rash  . Amoxicillin-Pot Clavulanate   . Cephalexin     REACTION: Rash  .  Cortisone     REACTION: itching after joint injection (lidocaine?), but has taken prednisone with no reaction  . Penicillins      Exam:  BP 135/84 mmHg  Pulse 93  Temp(Src) 98 F (36.7 C) (Oral)  Resp 16  SpO2 100% Gen: Well NAD Right wrist slight ecchymosis dorsally. Tender palpation ulnar dorsal aspect of the wrist. Pain with extension and ulnar deviation. Grip strength motion capillary refill pulses and sensation are intact  No results found for this or any previous visit (from the past 24 hour(s)). Dg Wrist Complete Right  10/19/2014   CLINICAL DATA:  Right wrist pain for 1 week, no known injury  EXAM: RIGHT WRIST - COMPLETE 3+ VIEW  COMPARISON:  None.  FINDINGS: Four views of the right wrist submitted. No acute fracture or subluxation. Radiocarpal joint space is preserved.  IMPRESSION: Negative.   Electronically Signed   By: Natasha MeadLiviu  Pop M.D.   On: 10/19/2014 18:01    Assessment and Plan: 54 y.o. female with wrist contusion. Plan for wrist brace and NSAIDs. Follow up with sports medicine in 2 weeks if not better for further evaluation.  Discussed warning signs or symptoms. Please see discharge instructions. Patient expresses  understanding.     Rodolph Bong, MD 10/19/14 [phone]

## 2014-11-01 ENCOUNTER — Encounter: Payer: Self-pay | Admitting: *Deleted

## 2014-11-01 ENCOUNTER — Emergency Department
Admission: EM | Admit: 2014-11-01 | Discharge: 2014-11-01 | Disposition: A | Discharge status: Home / Self Care | Payer: 59 | Source: Home / Self Care | Attending: Family Medicine | Admitting: Family Medicine

## 2014-11-01 DIAGNOSIS — J111 Influenza due to unidentified influenza virus with other respiratory manifestations: Secondary | ICD-10-CM

## 2014-11-01 DIAGNOSIS — R69 Illness, unspecified: Principal | ICD-10-CM

## 2014-11-01 LAB — POCT URINALYSIS DIP (MANUAL ENTRY)
Bilirubin, UA: NEGATIVE
Blood, UA: NEGATIVE
Glucose, UA: NEGATIVE
Ketones, POC UA: NEGATIVE
Leukocytes, UA: NEGATIVE
Nitrite, UA: NEGATIVE
Protein Ur, POC: NEGATIVE
Spec Grav, UA: <=1.005 (ref 1.005–1.03)
Urobilinogen, UA: 0.2 (ref 0–1)
pH, UA: 6 (ref 5–8)

## 2014-11-01 MED ORDER — BENZONATATE 200 MG PO CAPS: 200.0000 mg | ORAL_CAPSULE | Freq: Every day | ORAL | Status: AC

## 2014-11-01 MED ORDER — OSELTAMIVIR PHOSPHATE 75 MG PO CAPS: 75.0000 mg | ORAL_CAPSULE | Freq: Two times a day (BID) | ORAL | Status: AC

## 2014-11-01 NOTE — Discharge Instructions (Signed)
Take plain guaifenesin (1200mg  extended release tabs such as Mucinex) twice daily, with plenty of water, for cough and congestion.  May add Pseudoephedrine for sinus congestion.  Get adequate rest.   Try warm salt water gargles for sore throat.  Stop all antihistamines for now, and other non-prescription cough/cold preparations. May take Ibuprofen 200mg , 4 tabs every 8 hours with food for body aches, fever, etc.    Follow-up with family doctor if not improving about one week.

## 2014-11-01 NOTE — ED Provider Notes (Signed)
CSN: [id number]     Arrival date & time 11/01/14  1740 History   First MD Initiated Contact with Patient 11/01/14 1804     Chief Complaint  Patient presents with  . Fever  . Generalized Body Aches  . Cough      HPI Comments: Yesterday afternoon patient developed dry cough, fatigue, myalgias, headache, and fever/chills.  Her husband, who had been exposed to someone with influenza, was treated for an influenza like illness yesterday.  She notes that she has had increased urinary frequency without dysuria.  The history is provided by the patient.    Past Medical History  Diagnosis Date  . Lesion of brain   . Hypertension   . Lung granuloma     left lung   Past Surgical History  Procedure Laterality Date  . Melanoma excision    . Cyst excision     Family History  Problem Relation Age of Onset  . Hyperlipidemia Father   . Hypertension Father   . Aneurysm Father    History  Substance Use Topics  . Smoking status: Former Games developermoker  . Smokeless tobacco: Never Used  . Alcohol Use: No   OB History    No data available     Review of Systems No sore throat + cough No pleuritic pain No wheezing No nasal congestion No post-nasal drainage No sinus pain/pressure No itchy/red eyes No earache No hemoptysis No SOB + fever, + chills + nausea No vomiting No abdominal pain No diarrhea + constipation + urinary frequency No skin rash + fatigue + myalgias + headache Used OTC meds without relief  Allergies  Amoxicillin; Amoxicillin-pot clavulanate; Cephalexin; Cortisone; and Penicillins  Home Medications   Prior to Admission medications   Medication Sig Start Date End Date Taking? Authorizing Provider  acetaminophen (TYLENOL) 325 MG tablet Take 650 mg by mouth every 6 (six) hours as needed for pain.    Historical Provider, MD  Ascorbic Acid (VITAMIN C) 100 MG tablet Take 100 mg by mouth daily.    Historical Provider, MD  b complex vitamins tablet Take 1 tablet by mouth  daily.    Historical Provider, MD  benzonatate (TESSALON) 200 MG capsule Take 1 capsule (200 mg total) by mouth at bedtime. Take as needed for cough 11/01/14   Lattie HawStephen A Beese, MD  cetirizine (ZYRTEC) 10 MG tablet Take 10 mg by mouth as needed.    Historical Provider, MD  diclofenac (VOLTAREN) 75 MG EC tablet Take 1 tablet (75 mg total) by mouth 2 (two) times daily as needed. 10/19/14   Rodolph BongEvan S Corey, MD  MULTIPLE VITAMIN PO Take by mouth.    Historical Provider, MD  nortriptyline (PAMELOR) 10 MG capsule Take 10 mg by mouth at bedtime.    Historical Provider, MD  oseltamivir (TAMIFLU) 75 MG capsule Take 1 capsule (75 mg total) by mouth every 12 (twelve) hours. 11/01/14   Lattie HawStephen A Beese, MD  topiramate (TOPAMAX) 50 MG tablet Take 50 mg by mouth 2 (two) times daily.    Historical Provider, MD   BP 137/86 mmHg  Pulse 121  Temp(Src) 100.1 F (37.8 C) (Oral)  Resp 18  Wt 241 lb (109.317 kg)  SpO2 96% Physical Exam Nursing notes and Vital Signs reviewed. Appearance:  Patient appears stated age, and in no acute distress Eyes:  Pupils are equal, round, and reactive to light and accomodation.  Extraocular movement is intact.  Conjunctivae are not inflamed  Ears:  Canals normal.  Tympanic membranes  normal.  Nose:  Mildly congested turbinates.  No sinus tenderness.  Pharynx:  Normal Neck:  Supple.   Tender enlarged posterior nodes are palpated bilaterally  Lungs:  Clear to auscultation.  Breath sounds are equal.  Heart:  Regular rate and rhythm without murmurs, rubs, or gallops.  Abdomen:  Nontender without masses or hepatosplenomegaly.  Bowel sounds are present.  No CVA or flank tenderness.  Extremities:  No edema.  No calf tenderness Skin:  No rash present.   ED Course  Procedures  None    Labs Reviewed  POCT URINALYSIS DIP (MANUAL ENTRY)         MDM   1. Influenza-like illness    Begin Tamiflu.  Prescription written for Benzonatate [facility]) to take at bedtime for night-time cough.   Take plain guaifenesin (  extended release tabs such as Mucinex) twice daily, with plenty of water, for cough and congestion.  May add Pseudoephedrine for sinus congestion.  Get adequate rest.   Try warm salt water gargles for sore throat.  Stop all antihistamines for now, and other non-prescription cough/cold preparations. May take Ibuprofen , 4 tabs every 8 hours with food for body aches, fever, etc.    Follow-up with family doctor if not improving about one week    Lattie Haw, MD 11/01/14 1843

## 2014-11-01 NOTE — ED Notes (Signed)
Huntley DecSara c/o body aches, fever cough, polyuria and back pain x last night. No flu  Vac this season. IBF OTC @ 3pm.

## 2014-11-04 ENCOUNTER — Telehealth: Payer: Self-pay | Admitting: Emergency Medicine

## 2016-06-19 IMAGING — DX DG WRIST COMPLETE 3+V*R*
4 series · 4 of 4 positions shown · non-contrast
Comparison: None.

CLINICAL DATA: Right wrist pain for 1 week, no known injury

EXAM:
RIGHT WRIST - COMPLETE 3+ VIEW

[wrist pa]
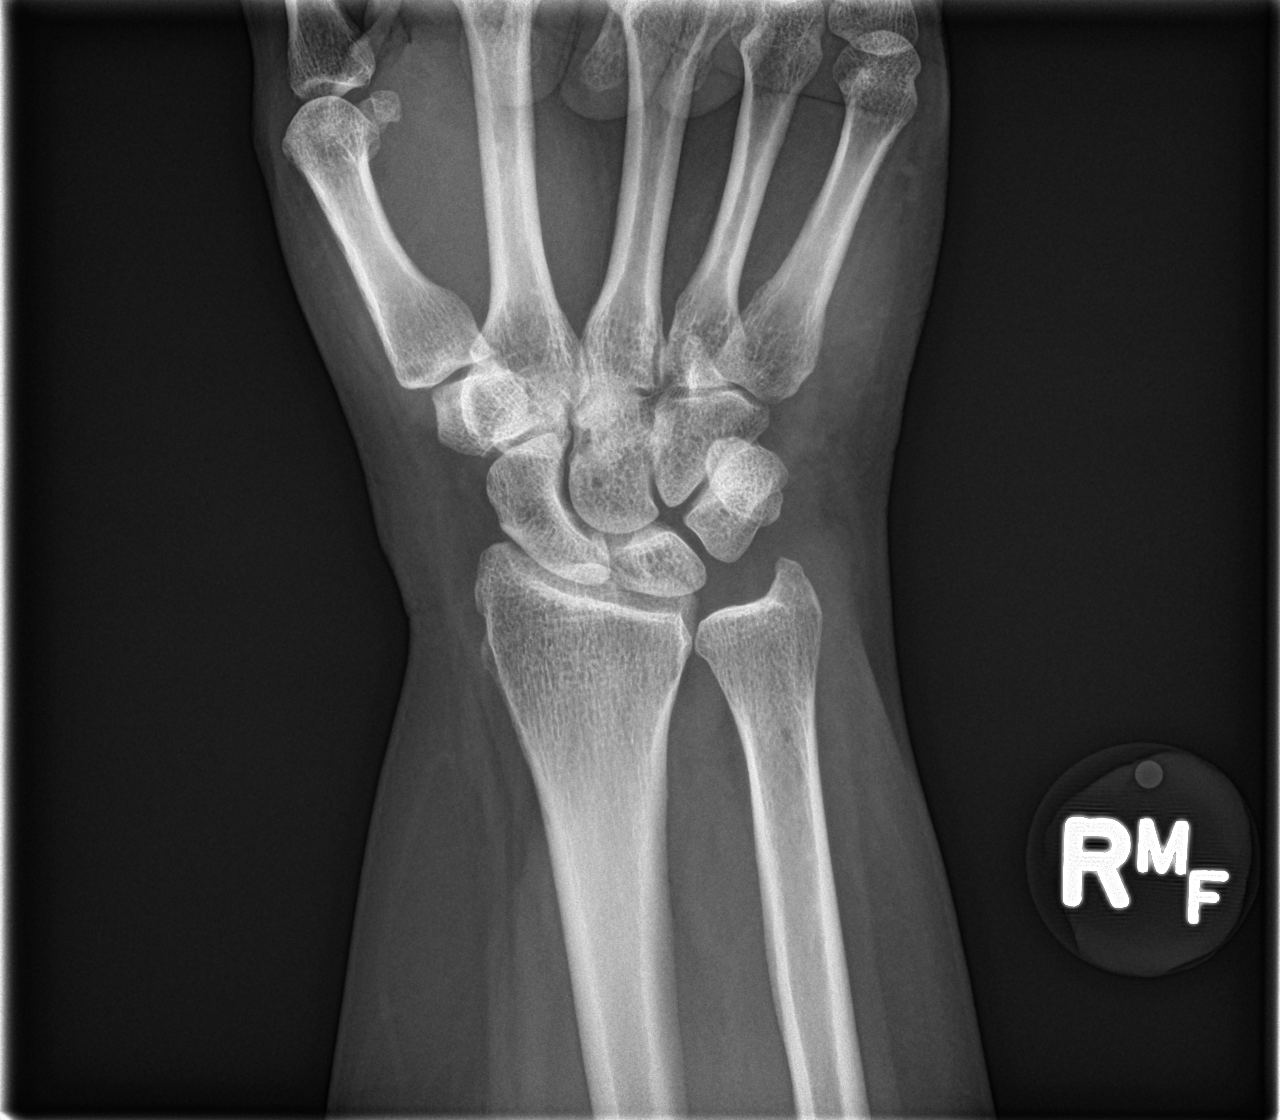

[wrist obl]
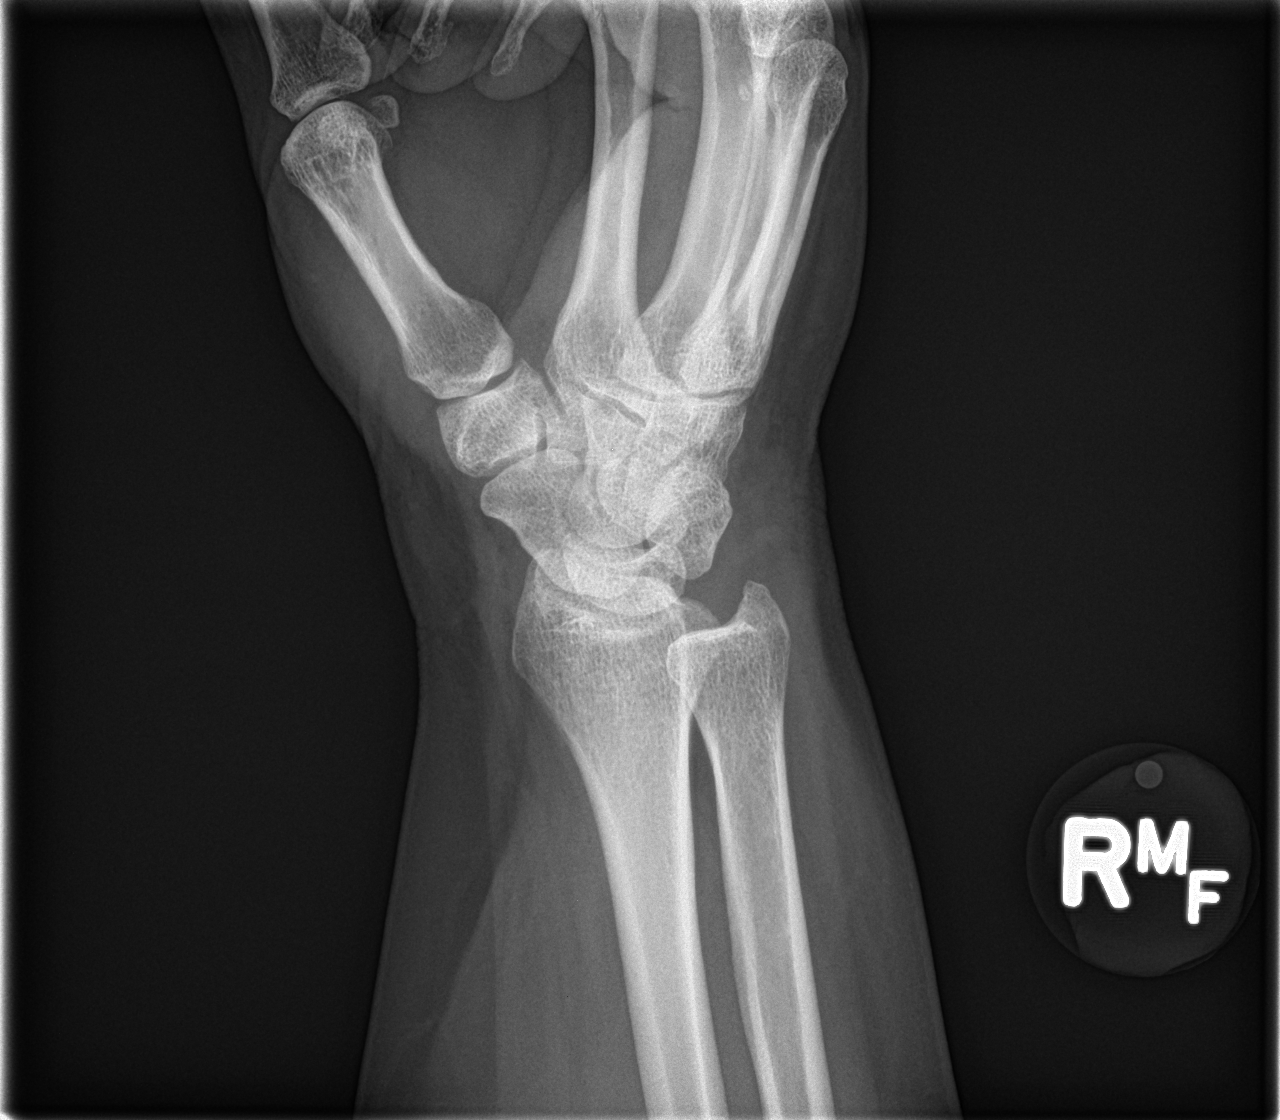

[wrist lat]
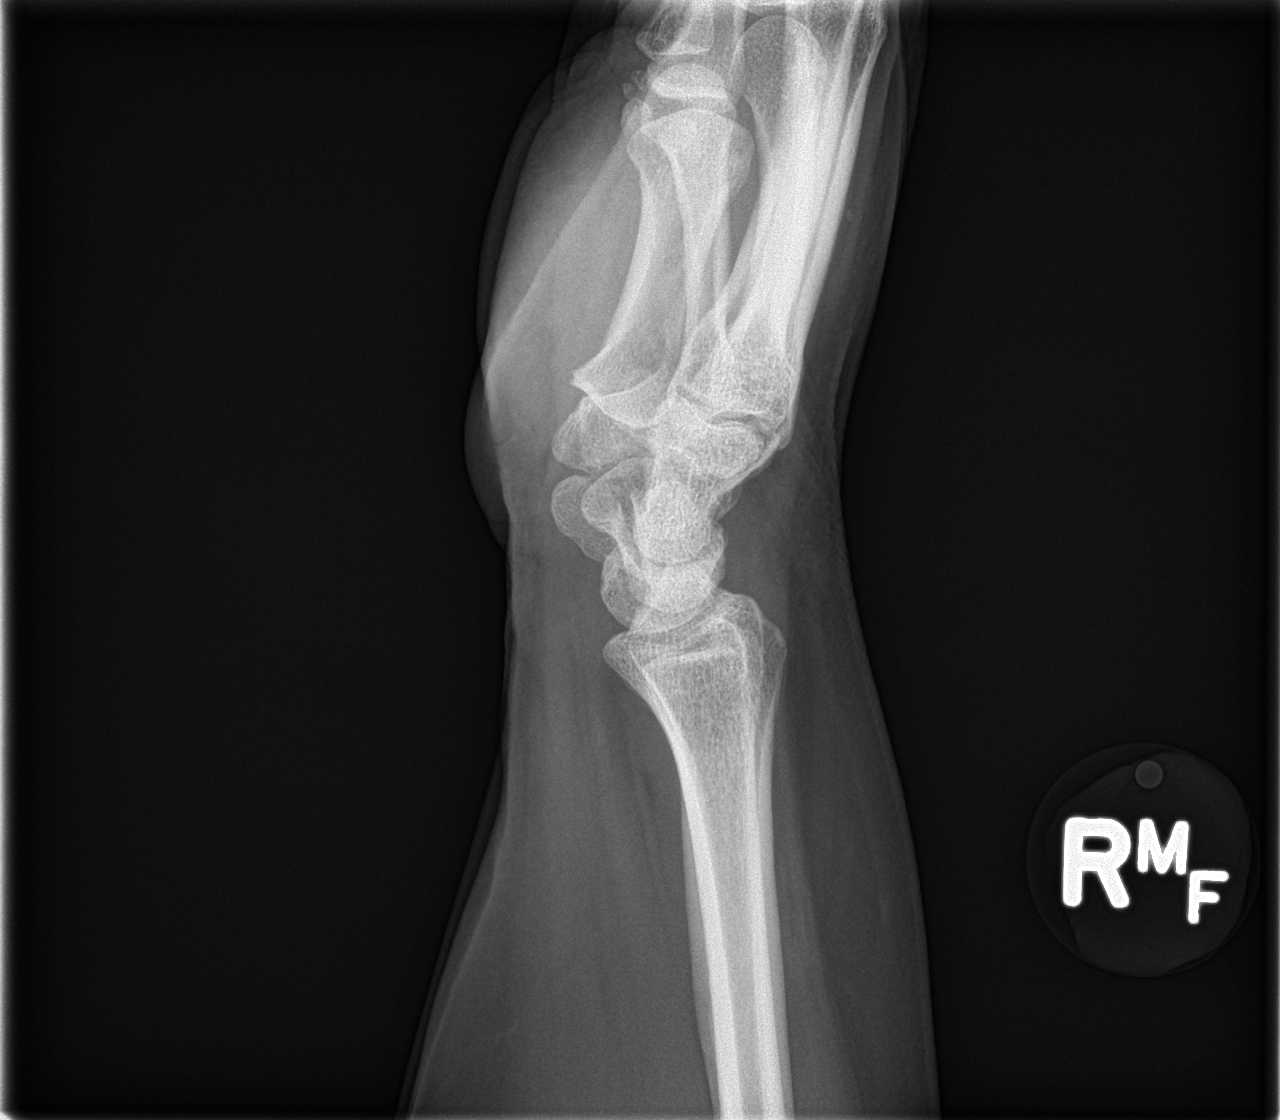

[wrist navicular]
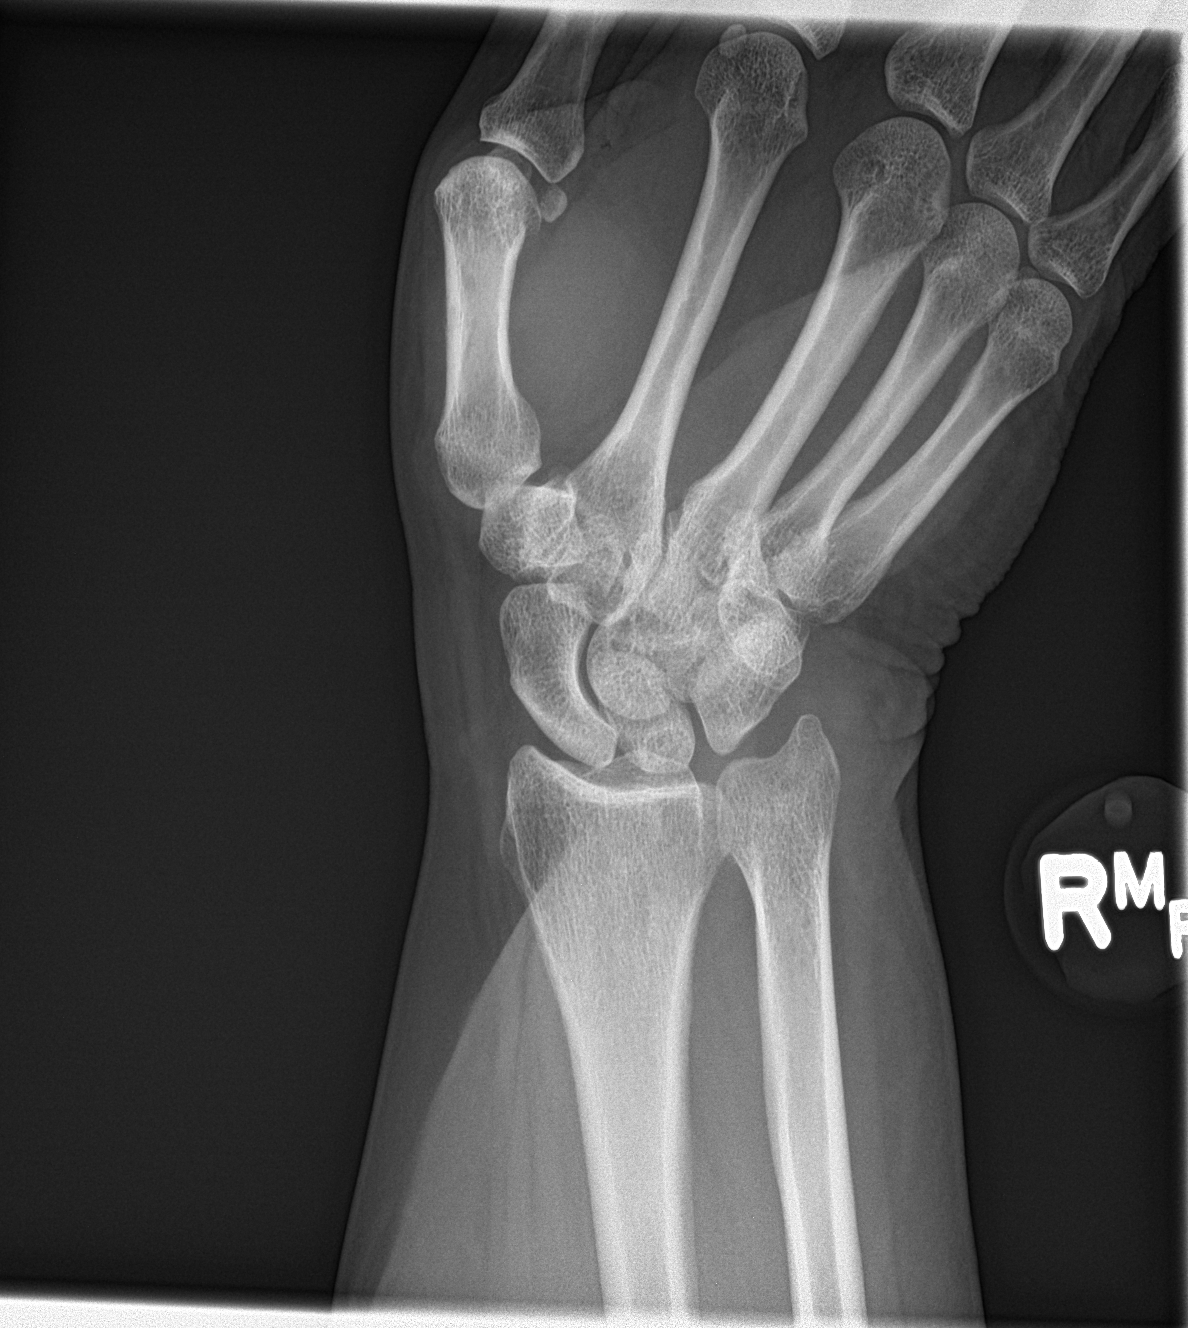

[4 of 4 positions shown; findings below may reference images not displayed]

FINDINGS: Four views of the right wrist submitted. No acute fracture or
subluxation. Radiocarpal joint space is preserved.
IMPRESSION: Negative.

## 2021-10-12 ENCOUNTER — Ambulatory Visit (INDEPENDENT_AMBULATORY_CARE_PROVIDER_SITE_OTHER): Payer: Medicaid Other | Admitting: Licensed Clinical Social Worker

## 2021-10-12 DIAGNOSIS — F411 Generalized anxiety disorder: Secondary | ICD-10-CM | POA: Diagnosis not present

## 2021-10-12 DIAGNOSIS — F909 Attention-deficit hyperactivity disorder, unspecified type: Secondary | ICD-10-CM

## 2021-10-12 DIAGNOSIS — F321 Major depressive disorder, single episode, moderate: Secondary | ICD-10-CM

## 2021-10-12 DIAGNOSIS — F4321 Adjustment disorder with depressed mood: Secondary | ICD-10-CM

## 2021-10-12 NOTE — Progress Notes (Signed)
Comprehensive Clinical Assessment (CCA) Note  10/12/2021 Jill Huang [id number]  Chief Complaint:  Chief Complaint  Patient presents with   Depression   Anxiety   Grief   ADHD    Mainly trouble with focus attention   Family conflict   Visit Diagnosis: Grief, major depressive disorder, moderate without prior episode, ADHD unspecified type, generalized anxiety disorder     CCA Biopsychosocial Intake/Chief Complaint:  struggling husband passed away last year. Son and patient not getting along he is a Equities trader in high school dealing with estates from [employer] mom he passed a year before he did.  Patient is selling mother-in-law's house and feels overwhelmed managing both her husband and mother-in-law's estate. Husband died in 03-Oct-2022 of last year of pancreatic cancer. Son on medication and goes therapy when he remembers thinks he is not dealing with anything patient can't do everything on home. Lies to her, doesn't do chores and homework. Can't force him to do anything even go to school. both had ADHD, both attentive type.  Current Symptoms/Problems: grief issues-did grief counseling with [facility] when husband passed away. Last month was her last session.  Patient needed to have counseling more often but it did help, depression, chronic anxiety. Was at a standstill and this year trying to push forward but downfall selling her house and so things everywhere.   Patient Reported Schizophrenia/Schizoaffective Diagnosis in Past: No   Strengths: had always been the optimist in family as husband got sicker and COVID in the midst of this, would have to find joy being outside doing things, playing the piano. Bought a light to help with weather patterns that impacted her mood. Good a cooking, good at music, always thought good at cleaning house although house is a wreck. Assertive not afraid to speak up if something needs to be said  Preferences: wants to get help with ADHD part as far as being  able to organize, diagnosed when son was diagnosed a couple of years ago. want to figure out how to relate to son know he is going to leave the house and have his own life but feels doesn't care about her, doesn't have supports, has her mom she is 72 a ways out, sister-in-law 10 minutes but busy and younger, sister is an hour away. Throws money at things her solution. Friends with people at [employer]. Friends with neighbor who moved don't talk as much. Two friends from high school died feels she wants a best friend and hard to find. Lonely when spouse. 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 PCP try Xanax made her feel not as bad but still did not care about anything herself or anybody else and wanted to jump out the window. Medical issues-lesion on brain, found 18 months after son bored. started having ocular migraines never diagnosed until a couple of years ago.  At times cannot find the work for things could be memory issues memory issues getting older grief, physical keep checking few years could be seizure, cancer but didn't  find it. Takes Neurontin for nerve pain not diagnosed with fibromyalgia but different specialist has said she had it. PCP-Dr. Fransico [facility], back pain-physical therapy and shoulder pain, sciatic pain, medical for high cholesterol and high blood pressure her gynecologist is having her take progesterone. family history-mat uncle-had relationship killed the girl and  then killed himself. Knows other folks in family mild forms of depression and alcohol 2 cousin d/a.   Mental Health Symptoms Depression:   Change in energy/activity; Fatigue; Sleep (too much or little); Hopelessness; Irritability; Worthlessness; Tearfulness; Difficulty Concentrating (patient had three pregnancies and lost two. After each one post-partum took anti-depressant until felt better. PCP tried her on anti-depressant good headache and stomachache felt like she wanted to jump out the window. He gave her Xanax-same way but-)   Duration of Depressive symptoms:  Greater than two weeks   Mania:   None   Anxiety:    Difficulty concentrating; Fatigue; Irritability; Sleep; Worrying (worry but can't make a decision, worry what to do)   Psychosis:   None   Duration of Psychotic symptoms: No data recorded  Trauma:   None   Obsessions:   None   Compulsions:   None   Inattention:   None (Diagnosed with ADHD see above)   Hyperactivity/Impulsivity:  No data recorded  Oppositional/Defiant Behaviors:   None   Emotional Irregularity:   None   Other Mood/Personality Symptoms:  No data recorded   Mental Status Exam Appearance and self-care  Stature:   Average   Weight:   Overweight   Clothing:   Casual   Grooming:   Normal   Cosmetic use:   Age appropriate   Posture/gait:   Normal   Motor activity:   Not Remarkable   Sensorium  Attention:   Normal   Concentration:   Normal   Orientation:   X5   Recall/memory:   Normal   Affect and Mood  Affect:   Depressed; Tearful   Mood:   Anxious; Angry   Relating  Eye contact:   Normal   Facial expression:   Sad   Attitude toward examiner:   Cooperative   Thought and Language  Speech flow:  Normal   Thought content:   Appropriate to Mood and Circumstances   Preoccupation:   None   Hallucinations:   None   Organization:  No data recorded  [employer] of Knowledge:    Average   Intelligence:   Average   Abstraction:   Normal   Judgement:   Fair   Art therapist:   Realistic   Insight:   Fair   Decision Making:   Paralyzed   Social Functioning  Social Maturity:   Isolates   Social Judgement:   Normal   Stress  Stressors:   Family conflict; Grief/losses; Relationship; Transitions (dealing with lose of spouse transitioning, manage estates)   Coping Ability:  No data recorded  Skill Deficits:  No data recorded  Supports:  No data recorded    Religion:    Leisure/Recreation:    Exercise/Diet:     CCA Employment/Education Employment/Work Situation:    Education:     CCA Family/Childhood History Family and Relationship History:    Childhood History:     Child/Adolescent Assessment: n/a     CCA Substance Use Alcohol/Drug Use:                           ASAM's:  Six Dimensions  of Multidimensional Assessment  Dimension 1:  Acute Intoxication and/or Withdrawal Potential:      Dimension 2:  Biomedical Conditions and Complications:      Dimension 3:  Emotional, Behavioral, or Cognitive Conditions and Complications:     Dimension 4:  Readiness to Change:     Dimension 5:  Relapse, Continued use, or Continued Problem Potential:     Dimension 6:  Recovery/Living Environment:     ASAM Severity Score:    ASAM Recommended Level of Treatment:     Substance use Disorder (SUD)    Recommendations for Services/Supports/Treatments: Therapy, consider psychiatric evaluation    DSM5 Diagnoses: Patient Active Problem List   Diagnosis Date Noted   DERMATITIS, ATOPIC 01/14/2011   COUGH 08/30/2010   CUTANEOUS ERUPTIONS, DRUG-INDUCED 08/17/2009    Patient Centered Plan: Patient is on the following Treatment Plan(s):  Anxiety and Depression, grief, family stress, problems with focus and attention, need to complete assessment and treatment plan   Referrals to Alternative Service(s): Referred to  Alternative Service(s):   Place:   Date:   Time:    Referred to Alternative Service(s):   Place:   Date:   Time:    Referred to Alternative Service(s):   Place:   Date:   Time:    Referred to Alternative Service(s):   Place:   Date:   Time:     Cordella Register, LCSW

## 2021-11-10 ENCOUNTER — Ambulatory Visit (INDEPENDENT_AMBULATORY_CARE_PROVIDER_SITE_OTHER): Payer: Medicaid Other | Admitting: Licensed Clinical Social Worker

## 2021-11-10 DIAGNOSIS — F4321 Adjustment disorder with depressed mood: Secondary | ICD-10-CM | POA: Diagnosis not present

## 2021-11-10 DIAGNOSIS — F321 Major depressive disorder, single episode, moderate: Secondary | ICD-10-CM

## 2021-11-10 DIAGNOSIS — F909 Attention-deficit hyperactivity disorder, unspecified type: Secondary | ICD-10-CM

## 2021-11-10 DIAGNOSIS — F411 Generalized anxiety disorder: Secondary | ICD-10-CM

## 2021-11-10 NOTE — Progress Notes (Signed)
In person session ? ?THERAPIST PROGRESS NOTE ? ?Session Time: 2:00 PM to 2:50 PM ? ?Participation Level: Active ? ?Behavioral Response: CasualAlertAnxious, Dysphoric, and appropriate in session ? ?Type of Therapy: Individual Therapy ? ?Treatment Goals addressed: Work on attention issues, grief issues relationship issues, depression and anxiety ? ?ProgressTowards Goals: Initial still doing initial paperwork ? ?Interventions: Solution Focused, Strength-based, Supportive, and Other: grief, coping ? ?Summary: Jill Huang is a 61 y.o. female who presents with most of the session  related to finishing paperwork. There was a lot to finish helpful for engagement with patient and developing relationship.  Issues came up as patient answered questions patient for example saying in January made a decision she has been stuck long enough with grief issues made a decision to move forward.  Therapist guided patient that with loss we are on our own journey but she will come to a place and seems like she already has made progress of realizing she does not want to miss out on life even with loss of her husband.  Also therapist notes the strong connection she feels still with husband as she still counts them together even though he has passed.  Discussed relationship with son noted ups and downs and note this is positive as last session seem to be more of a significant stressor.  Talked about his plans therapist provided her feedback was positive the things he is planning to do which include going to [facility] and then transferring to [place].  Able to identify supports that include mom is one of her biggest supports.  Patient also just bought a car.  Noted this is accomplishment.Therapist framed what patient is going through is very challenging and having to manage two estates and also buy a car and to give herself complement for how she is managing, therapist also provided feedback supposed to be messy therapist impressed  sharing even with one estate can be overwhelming.  Therapist utilized CBT and solution focused brief therapy to address mood.  Therapist provided support and space to patient as she shared her thoughts and feelings in session.  Therapist provided patient with handout on ADHD to help her cope with things like focus concentration, anxiety as well as worksheets to help her in organizing. ? ?Suicidal/Homicidal: No ? ?Plan: Return again in 1 week.2.  Review handout on ADHD, complete treatment plan complete nutrition and pain assessment ? ?Diagnosis:   Major depressive disorder, recurrent, moderate generalized anxiety disorder, ADHD unspecified ADHD type, grief ? ?Collaboration of Care: Other none needed ? ?Patient/Guardian was advised Release of Information must be obtained prior to any record release in order to collaborate their care with an outside provider. Patient/Guardian was advised if they have not already done so to contact the registration department to sign all necessary forms in order for [place] to release information regarding their care.  ? ?Consent: Patient/Guardian gives verbal consent for treatment and assignment of benefits for services provided during this visit. Patient/Guardian expressed understanding and agreed to proceed.  ? ?Cordella Register, LCSW ?11/10/2021 ? ?

## 2021-11-17 ENCOUNTER — Encounter (HOSPITAL_COMMUNITY): Payer: Self-pay

## 2021-11-17 ENCOUNTER — Ambulatory Visit (INDEPENDENT_AMBULATORY_CARE_PROVIDER_SITE_OTHER): Payer: PRIVATE HEALTH INSURANCE | Admitting: Licensed Clinical Social Worker

## 2021-11-17 DIAGNOSIS — F411 Generalized anxiety disorder: Secondary | ICD-10-CM | POA: Diagnosis not present

## 2021-11-17 DIAGNOSIS — F909 Attention-deficit hyperactivity disorder, unspecified type: Secondary | ICD-10-CM

## 2021-11-17 DIAGNOSIS — F321 Major depressive disorder, single episode, moderate: Secondary | ICD-10-CM | POA: Diagnosis not present

## 2021-11-17 DIAGNOSIS — F4321 Adjustment disorder with depressed mood: Secondary | ICD-10-CM

## 2021-11-17 NOTE — Plan of Care (Signed)
Patient participated in treatment plan °

## 2021-11-17 NOTE — Plan of Care (Signed)
Na

## 2021-11-17 NOTE — Progress Notes (Signed)
? ?THERAPIST PROGRESS NOTE ? ?Session Time: 2:00 PM to 2:56 PM ? ?Participation Level: Active ? ?Behavioral Response: CasualAlertDepressed ? ?Type of Therapy: Individual Therapy ? ?Treatment Goals addressed: Depression, anxiety, grief, strategies for ADHD, coping ? ?ProgressTowards Goals: Initial-created treatment plan today but actively worked on goal of coping with grief, processing feelings related to being overwhelmed utilizing reframing for patient to recognize this as natural process of grief experience and will have better and worse times that she works through grief ? ?Interventions: Solution Focused, Strength-based, Supportive, Reframing, and Other: grief ? ?Summary: Jill Huang is a 61 y.o. female who presents with shoulder really hurting sprained may be ball out of socket and needs to get to Orthopedic clinic very soon. Shared history had physical therapy for this before also dealing with back pain and sciatic problems.  Thought it was a wrote [place] cuff issue but not may be a bone spur, maybe pinching a nerve.  Reviewed depression and how it surfaces for patient relates to feeling overwhelmed but patient relates feels loneliness, take to mom a little bit better now. Friends from church are good. Miss Rise Paganini good friend, friend Opal Sidles, Network engineer at [employer]. Sister hardly calls nothing new. Find calls friends wishes that they would call her.  Thought about doing a grief group and did a couple things with [facility]. Also had been going to counselor for a year once a month.  Did a group of art therapy and coping with holidays.  Patient became tearful in describing difficult for her to say anything even introduce herself.  She wants to go back home she realizes that could be good for her and reconnect with people her age.  Became tearful and sharing with therapist her best friends from high school died one when pregnant with son has liver cancer. Another when son was 18 months broke her leg blood clot. Hard  to find new connection and new best friend. When son starts classes think of taking classes.  Shared her interest in things like plumbing, welding electrical work automobile class.  Interested in things where she can destroy things interested in driving big equipment like forklift, big equipment, throwing things, wants to do traveling has family in [place].  Has an older uncle who wants to go to [place]. Right now no time to go. Would like to finalize the estates by summer, sell the house in [place] of selling the camper and truck.  Glad cousin calling her lost a younger sibling. Trying to figure out things with son. He thinks she is controlling she just wants him to take responsibility with his part of house, both of them not good at house maintenance, house looks like hoarders right now. Family therapy waiting list. His own therapy.  Discussed strategies and patient thinks would be a good idea for him to pay for services for car as check engine light has been on and has not set anything.  Therapist agreed best ways to learn from experience.  Also has a half sister of husband and her husband who were helpful 1 time when patient was good to school could bring them in therapist encouraged this.  Patient concerned he is very soft and sensitive in [place] but still thinks he could use some guidance if needed.  ? ?Therapist reviewed symptoms, facilitated expression of thoughts and feelings.  Completed treatment plan noted patient's feeling of feeling overwhelmed and wanting to give up process this by saying process of grief will go through periods where  feel helpless but then will have moments or days where feel better and recognize this is part of the pattern.  As she gets better she will have better days and be more future focused.  Noted hopelessness comes from all or nothing thinking that nothing will get better which is distortion no such thing is black and white but there usually is a mixture of  different things that make up the whole.  Encourage patient with some plans that we will help her with coping including getting back to where she is from can be helpful, joining classes when she feels better, identified and labeled what she needs is a new best friend identifying helping with knowing what she needs but also has to be patient with the process of finding this person but assessed patient open to reaching out different ways to find that.  Encourage patient with the understanding that she will get to a place where she enjoys her self being her own best company in her life has value even though she is lost her husband.  Work with patient on relationship with son and struggles encouraged son learning the hard way things to his own experience encouraging those lessons also bringing in supports that can help guide him learning what he needs to do to be more responsible and become an adult.  Therapist provided active listening open questions supportive interventions. ?Suicidal/Homicidal: No ? ?Plan: Return again in 1 week.2.  Work with patient on grief, look at grief handout challenging cognitive distortions leading to anxiety and depression look at CBT handout ? ?Diagnosis:  Major depressive disorder, recurrent, moderate generalized anxiety disorder, ADHD unspecified ADHD type, grief ? ?Collaboration of Care: Other none needed ? ?Patient/Guardian was advised Release of Information must be obtained prior to any record release in order to collaborate their care with an outside provider. Patient/Guardian was advised if they have not already done so to contact the registration department to sign all necessary forms in order for [place] to release information regarding their care.  ? ?Consent: Patient/Guardian gives verbal consent for treatment and assignment of benefits for services provided during this visit. Patient/Guardian expressed understanding and agreed to proceed.  ? ?Cordella Register, LCSW ?11/17/2021 ? ?

## 2021-11-24 ENCOUNTER — Ambulatory Visit (INDEPENDENT_AMBULATORY_CARE_PROVIDER_SITE_OTHER): Payer: Medicaid Other | Admitting: Licensed Clinical Social Worker

## 2021-11-24 DIAGNOSIS — F4321 Adjustment disorder with depressed mood: Secondary | ICD-10-CM | POA: Diagnosis not present

## 2021-11-24 DIAGNOSIS — F321 Major depressive disorder, single episode, moderate: Secondary | ICD-10-CM | POA: Diagnosis not present

## 2021-11-24 DIAGNOSIS — F411 Generalized anxiety disorder: Secondary | ICD-10-CM

## 2021-11-24 DIAGNOSIS — F909 Attention-deficit hyperactivity disorder, unspecified type: Secondary | ICD-10-CM

## 2021-11-24 NOTE — Progress Notes (Signed)
In person session ? ?THERAPIST PROGRESS NOTE ? ?Session Time: 2:02 PM to 2:55 PM ? ?Participation Level: Active ? ?Behavioral Response: CasualAlertAnxious, Dysphoric, and appropriate in session working on positive reframing ? ?Type of Therapy: Individual Therapy ? ?Treatment Goals addressed:   Major depressive disorder, recurrent, moderate generalized anxiety disorder, ADHD unspecified ADHD type, grief ? ?ProgressTowards Goals: Progressing-patient identified some progress with grief and therapist own assessment of patient focusing on moving forward utilized session actively to work on stressors therapist using reframing to help with coping as well as challenging negative self talk ? ?Interventions: CBT, Solution Focused, Strength-based, Reframing, and Other: Grief coping ? ?Summary: Jill Huang is a 61 y.o. female who presents with got Prednisone from Dr. Wandra Feinstein her from sleeping checked in with therapist and said it is "a whirlwind". Shoulder feels better has been going on for more than a year and half. Doing better with grief. Preacher on Sunday had to bring it up that can choke her up. Bought a new car.  As we began to talk about her experience when he was sick noted we were following back into talking about it patient said does not want to think about that part as much-that is his experience of his having cancer and failing. Thank goodness sister-in-law was there. Hopes son working through the trauma.  Focused on moving forward since January Preacher brought that up in January thanked him for that as it positively impacted her. She had a service when he has birthday. Beautiful service.  Sees it is God's word for her to move forward. Talked about ways she wants to move forward. Need to do taxes wants mother-in-law house cleaned out and has lots of offers on the house. Met with lawyer there is a snafu with work stocks work Geophysical data processor for estate has to have a delay. Estates are open. Wants to close on  mother-in-law house sell the house camper and truck. Here is the problem her house is a Therapist, art. Don't have a lot of time busy when down time tired. Son ADHD disorganized and problems with being on time. House disaster and patient is in [place] cleaning. Reading a book called "Can't have anybody over syndrome" to hep her.Exhausting going to lawyer, pick up new mats for car, hung out with friend. Sit down and was going to watch television but fell asleep.  Discussed difficulty with ADHD with processing in order to figure out organization can't figure out organization and once can't go back to original. Adam executive functioning issues. Tim alive help her with things. He sees her room a mess and thinks why should I clean. Easier to do at mother-in-law hopes can do it at her house. Inner dialogue she has will say lazy, you don't feel like cleaning when home don't feel like putting dishes in the dishwasher.  She has been positive one and working on being more positive. Friend and her sisters help once a week.  Patient reviewed her philosophy done and move forward, not going to stress about it even though she backtracks and says but I am stressed.  Patient shares stuff will come up and her thoughts like she is old and fat but  try to positive. When Tim gone last year in dark hole. Trying to climb out spring and helps. Thinking start a business or start a job.  Thought about life classes to teach such plumbing or build a shelf.  In terms of her taking classes thinking first would take electrical class.   ? ?  Therapist reviewed symptoms, facilitated expression of thoughts and feelings utilize this for patient to help her in coping with feeling overwhelmed with different things she needs to address and managing to close to estates.  Therapist utilized reframing when patient was getting down on herself about not having her house clean that she only has so much energy that she is in fact cleaning at mother-in-law's and  patient herself expanded on this that besides that there is a lot of other things need done that pulls her in different directions.  Therapist encouraged patient to give herself a break talked herself and compassionate ways noting to just things by getting 1 thing done at a time and she will continue to move forward through everything.  Encourage patient with her own perspective moving forward that she is making effort to do this discussed ways she can do that therapist opening many different possibilities encouraging ones patient is thinking about when ready after closing estates.  Therapist provided her insight that framing things positively help with coping and help with mood.  Noted ways we use self talk to either make [place] feel worse or we can use to help [place] cope and feel more positively.  Assess feelings around stressors related to son validated patient how she was feeling providing supportive intervention.  Therapist provided active listening and provided support and space for patient as she shared her thoughts and feelings in session.  Noted progress with patient moving forward with grief as she identified ? ?Suicidal/Homicidal: No ? ?Plan: Return again in 3 weeks.2.  Courage patient with positive perspective and coping of moving forward well at the same time providing supportive and strength-based interventions to manage stressors, processing feelings in session ? ?Diagnosis: Major depressive disorder, recurrent, moderate generalized anxiety disorder, ADHD unspecified ADHD type, grief ? ?Collaboration of Care: Other none needed ? ?Patient/Guardian was advised Release of Information must be obtained prior to any record release in order to collaborate their care with an outside provider. Patient/Guardian was advised if they have not already done so to contact the registration department to sign all necessary forms in order for [place] to release information regarding their care.  ? ?Consent: Patient/Guardian gives  verbal consent for treatment and assignment of benefits for services provided during this visit. Patient/Guardian expressed understanding and agreed to proceed.  ? ?Cordella Register, LCSW ?11/24/2021 ? ?

## 2021-12-15 ENCOUNTER — Ambulatory Visit (INDEPENDENT_AMBULATORY_CARE_PROVIDER_SITE_OTHER): Payer: Medicaid Other | Admitting: Licensed Clinical Social Worker

## 2021-12-15 DIAGNOSIS — F321 Major depressive disorder, single episode, moderate: Secondary | ICD-10-CM

## 2021-12-15 DIAGNOSIS — F411 Generalized anxiety disorder: Secondary | ICD-10-CM | POA: Diagnosis not present

## 2021-12-15 DIAGNOSIS — F909 Attention-deficit hyperactivity disorder, unspecified type: Secondary | ICD-10-CM

## 2021-12-15 DIAGNOSIS — F4321 Adjustment disorder with depressed mood: Secondary | ICD-10-CM | POA: Diagnosis not present
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
## 2021-12-22 ENCOUNTER — Ambulatory Visit (INDEPENDENT_AMBULATORY_CARE_PROVIDER_SITE_OTHER): Payer: Medicaid Other | Admitting: Licensed Clinical Social Worker

## 2021-12-22 DIAGNOSIS — F909 Attention-deficit hyperactivity disorder, unspecified type: Secondary | ICD-10-CM

## 2021-12-22 DIAGNOSIS — F321 Major depressive disorder, single episode, moderate: Secondary | ICD-10-CM | POA: Diagnosis not present

## 2021-12-22 DIAGNOSIS — F4321 Adjustment disorder with depressed mood: Secondary | ICD-10-CM | POA: Diagnosis not present

## 2021-12-22 DIAGNOSIS — F411 Generalized anxiety disorder: Secondary | ICD-10-CM

## 2021-12-22 NOTE — Progress Notes (Signed)
? ?THERAPIST PROGRESS NOTE ? ?Session Time: 9:05 AM to 9:52 AM ? ?Participation Level: Active ? ?Behavioral Response: CasualAlertAnxious and Dysphoric ? ?Type of Therapy: Individual Therapy ? ?Treatment Goals addressed:  Work on grief issues and issues coming up with grieving, coping, strategies for ADHD ? ?ProgressTowards Goals: Progressing-noted processing feelings as well as identifying helpful coping strategies for patient to apply will help her with feeling overwhelmed including expression of not eating the whole apply it once tackling things bit by bit and saying in a way to really sink in the message, also allowing God to do guide her and trust the process ? ?Interventions: CBT, Solution Focused, Strength-based, Supportive, and Other: Coping ? ?Summary: Jill Huang is a 61 y.o. female who presents with discussing organization strategies.  Sharing frustrations with son still not helping with chores, shared the great news that he looks like he is going to graduate but also having a 504 plan and not using it feeling self-conscious around peers.  Therapist noted and are developing coming to understand who cares what people think as well as things not such a big deal, mistakes happen not perfect.  Patient coming to understand that People don't even know when making mistakes.  Discussed situations that can be fearful and the more we do it the more helps [place] get over fear. Sometime though we have to start with fear and just have to do it. Number of times matter.  Patient has been reflective about life that she has had wonderful life wonderful life around her.  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  Patient also shared about some ideas she has that she think would be popular such as an adult fix it class.  Another idea is "rent a Doctor, hospital". That would perhaps be more a ministry. She doesn't know what God has planned but open and do what feels right at moment and trust  in that. Trust gives her peace takes weight off her shoulders.  Feels helpful to try to stay busy as she is figuring this out work on selling the house, Jill Huang's graduation, clean her house, wedding, proms. Sister-in-law husband's half sister Jill Huang very good support at different times such as when husband was sick. Advice given to patient to not eat the pie all at once take a slice at a time. Stuck with her. Take time and not stress take off things from plate as long patient says moving forward and busy good. Need to focus on Jill Huang graduation, nephew's wedding, spring concert and prom. Is having trouble making decisions figure out then back track until her self made a mistake but move on.  Noted patient's own insight talked about in session helpful in decision making as well as not dwelling to longer indecision is never get to know if it is the perfect 1 but moving forward in the process as important and can adjust as she goes ? ?Therapist working with patient on managing stressors noted some helpful why she is getting and encouraged patient with utilizing that device to help her with not being overwhelmed such as the expression do not eat the whole pie once but take it a slice at a time applying this to patient slowing down with tasks that she needs to do.  Explored tasks and therapist impression was what she was trying to do was almost impossible with things like weddings and graduation so that slowing things down taking things off her plate would  help her destress.  Therapist noted decision making that it is good not to perseverate we do not know how will turn out but often does not turn out as we thought were not perfect but important to move forward not stay stuck in decision making.  That we will keep her stuck as opposed to moving forward and noting can adjust she goes also noting it will be okay in her decision making.  Noted another significant thing patient said in session was telling herself not sure what  God plan but open to the process interest will be okay is another very useful insight to coping.  Noted in session talking about patient's career some family history noting her strengths from different careers and talents she has such as playing music and singing.  Noted some attitudes in general helpful as we moved to life such as situations or not such a big deal as we think they are and coming to understand that, that mistakes will happen and that is mistakes or not to be a good deal as we think they are.  Therapist continuing to provide support and helping patient with insight to help her move forward with goals in her life. ? ?Suicidal/Homicidal: No ? ?Plan: Return again in 1 week.2.  Look at [facility] handout, grief information, processed feelings in session talk about issues that patient needs to work through ? ?Diagnosis: Major depressive disorder, recurrent, moderate generalized anxiety disorder, ADHD unspecified ADHD type, grief ? ?Collaboration of Care: Other none needed ? ?Patient/Guardian was advised Release of Information must be obtained prior to any record release in order to collaborate their care with an outside provider. Patient/Guardian was advised if they have not already done so to contact the registration department to sign all necessary forms in order for [place] to release information regarding their care.  ? ?Consent: Patient/Guardian gives verbal consent for treatment and assignment of benefits for services provided during this visit. Patient/Guardian expressed understanding and agreed to proceed.  ? ?Coolidge Breeze, LCSW ?12/22/2021 ? ?

## 2021-12-29 ENCOUNTER — Ambulatory Visit (INDEPENDENT_AMBULATORY_CARE_PROVIDER_SITE_OTHER): Payer: Medicaid Other | Admitting: Licensed Clinical Social Worker

## 2021-12-29 DIAGNOSIS — F411 Generalized anxiety disorder: Secondary | ICD-10-CM

## 2021-12-29 DIAGNOSIS — F909 Attention-deficit hyperactivity disorder, unspecified type: Secondary | ICD-10-CM | POA: Diagnosis not present

## 2021-12-29 DIAGNOSIS — F4321 Adjustment disorder with depressed mood: Secondary | ICD-10-CM

## 2021-12-29 DIAGNOSIS — F321 Major depressive disorder, single episode, moderate: Secondary | ICD-10-CM | POA: Diagnosis not present
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
## 2022-01-05 ENCOUNTER — Ambulatory Visit (INDEPENDENT_AMBULATORY_CARE_PROVIDER_SITE_OTHER): Payer: Medicaid Other | Admitting: Licensed Clinical Social Worker

## 2022-01-05 DIAGNOSIS — F411 Generalized anxiety disorder: Secondary | ICD-10-CM | POA: Diagnosis not present

## 2022-01-05 DIAGNOSIS — F909 Attention-deficit hyperactivity disorder, unspecified type: Secondary | ICD-10-CM | POA: Diagnosis not present

## 2022-01-05 DIAGNOSIS — F4321 Adjustment disorder with depressed mood: Secondary | ICD-10-CM

## 2022-01-05 DIAGNOSIS — F321 Major depressive disorder, single episode, moderate: Secondary | ICD-10-CM | POA: Diagnosis not present
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
## 2022-02-04 ENCOUNTER — Ambulatory Visit (INDEPENDENT_AMBULATORY_CARE_PROVIDER_SITE_OTHER): Payer: Medicaid Other | Admitting: Licensed Clinical Social Worker

## 2022-02-04 DIAGNOSIS — F4321 Adjustment disorder with depressed mood: Secondary | ICD-10-CM

## 2022-02-04 DIAGNOSIS — F411 Generalized anxiety disorder: Secondary | ICD-10-CM

## 2022-02-04 DIAGNOSIS — F909 Attention-deficit hyperactivity disorder, unspecified type: Secondary | ICD-10-CM | POA: Diagnosis not present

## 2022-02-04 DIAGNOSIS — F321 Major depressive disorder, single episode, moderate: Secondary | ICD-10-CM | POA: Diagnosis not present

## 2022-02-04 NOTE — Progress Notes (Signed)
Virtual Visit via Video Note  I connected with Jill Huang on 02/04/22 at  9:00 AM EDT by a video enabled telemedicine application and verified that I am speaking with the correct person using two identifiers.  Location: Patient: home Provider: home office   I discussed the limitations of evaluation and management by telemedicine and the availability of in person appointments. The patient expressed understanding and agreed to proceed.  I discussed the assessment and treatment plan with the patient. The patient was provided an opportunity to ask questions and all were answered. The patient agreed with the plan and demonstrated an understanding of the instructions.   The patient was advised to call back or seek an in-person evaluation if the symptoms worsen or if the condition fails to improve as anticipated.  I provided 53 minutes of non-face-to-face time during this encounter.    THERAPIST PROGRESS NOTE  Session Time: 9:00 AM to 9:53 AM  Participation Level: Active  Behavioral Response: CasualAlertAnxious  Type of Therapy: Individual Therapy  Treatment Goals addressed: Work on grief issues and issues coming up with grieving, coping, strategies for ADHD   ProgressTowards Goals: Progressing-worked on grief issues noted patient going to her family is helpful, processed feelings related to feeling overwhelmed look for strategies to help with this  Interventions: Solution Focused, Strength-based, Supportive, Reframing, and Other: Coping  Summary: Jill Huang is a 61 y.o. female who presents with her son had his band concert, his prom, then had nephew's wedding. Graduation this Saturday trying to get that settled. Getting ready is stressful. Still in overwhelmed feeling. Get ready to visit [place] to visit family stay for two weeks. Trying to get son to agree to go somewhere. Says fine doesn't need to go anywhere but willing to go to [place]. Ulysees Barns patient was closest  with him growing up and Brooke Dare, Dad's first cousin, but called Uncle Joe. Adam loves his wife and his son. Last year when Mike's sister died from [place] died a week before Tim passed. Family misses her so badly. June is a hard month her birthday, their anniversary. Tim's half sister her husband, he is from [place] last time last year asked if he needed help driving. Can they ride with them and drop them off. Last year saw family felt happy needed hugs felt like needed to hug each other wasn't able to be with each other with each other's love one passing. Has fun with there as well. Doesn't have to think about what to do next and what is going on. Will stay with Mike's sister Olivia Mackie. Fish and fish fry. Wants to chill and fish.  Therapist noted both getting away and being in nature is very helpful for her. When Tim sick when COVID sanity dependent on the weather. After the shock of the diagnosis and kids learning from home needed to be outside trimmed bushes, being outside being doing things like plant flowers and herb garden that turned out awesome. Therapist shared  impressed that with these difficulties she was able to cope like that. If raining and couldn't be outside would be depressed. Did what did to get through. Did acupuncture, CBD, light therapy. Getting along better with son. Wanted to get yard work done. Can't dig hole has rotator cuff issue. Told him he has to do his part around the house if he wants to go to his buddies dig the hole to plant the bush. He did. It was a lot of work. Hopes his being  responsible for paying for school will help him be motivated, kids need to grow up. Told him he can work for her and pay him. Hard to convince him in general of things. Hopefully community college help him with adult lessons. Hard to get him motivated. [place] is good but still needs a role model. Sister-in-law husband brother-in-law ex-military. Some good for him. Other things hesitant about it. With being  overwhelmed always hopeful get things done in summer takes care of things but doesn't happen because he wants to sleep all day. Hopes with college a schedule days that he get up earlier. Makes lists needs to do overwhelming when so much. Still want to get enough cleaned up when cousin comes to help clean not so embarrassed.  Therapist noted considering she gets a pass for everything she is going through. Trying to let go. Knows she won't think that. So much anxiety of getting things done wants home be a peaceful place to be rather than a messy pile. Describes feeling "stifled" makes it hard to take the next steps. Wants to sell camper, sell the house in [place], got through the stuff. Appointment with surgeon next week rotator cuff can't do anything if surgery.  Therapist noted it is what it is and having to come to accept that.  Therapist work with patient in guiding her from her perspective of being easier on herself, gaining perspective in her situation just doing the best she can and is good enough, actually giving herself a break as she works through the things she needs to accomplish.  Therapist noted when things are overwhelming the only thing you can do is tackle 1 thing at a time but again patient being less hard on herself recognizing there is a lot for her to do given her husband has to and working on [employer] giving herself credit for how she is managing.  Working with patient on acceptance for example letting go need to accomplish things if she has to have shoulder surgery, recognizing that it is just going to have to be the way it is.  Noted going to families will be helpful for her and her son who wants to go, helpful terms of June being a difficult month and being with family will help.  Noted getting away also helpful for managing stress, patient's interest in being outdoors also helpful for mental health.  Noted some positive sign improvement relationship with son and noted  patient creating situations to make him more responsible helps the process and helps him.  Therapist provided space and support for patient to talk about thoughts and feelings.     Suicidal/Homicidal: No  Plan: Return again in 2  weeks.2.Remember to tell patient one slice at a time, strategies for ADHD, grief, stress management  Diagnosis: Major depressive disorder, recurrent, moderate generalized anxiety disorder, ADHD unspecified ADHD type, grief  Collaboration of Care: Other none needed  Patient/Guardian was advised Release of Information must be obtained prior to any record release in order to collaborate their care with an outside provider. Patient/Guardian was advised if they have not already done so to contact the registration department to sign all necessary forms in order for [place] to release information regarding their care.   Consent: Patient/Guardian gives verbal consent for treatment and assignment of benefits for services provided during this visit. Patient/Guardian expressed understanding and agreed to proceed.   Cordella Register, LCSW 02/04/2022

## 2022-02-16 ENCOUNTER — Ambulatory Visit (HOSPITAL_COMMUNITY): Payer: Medicaid Other | Admitting: Licensed Clinical Social Worker

## 2022-03-17 ENCOUNTER — Ambulatory Visit (HOSPITAL_COMMUNITY): Payer: Medicaid Other | Admitting: Licensed Clinical Social Worker

## 2024-04-10 ENCOUNTER — Other Ambulatory Visit: Payer: Self-pay | Admitting: Medical Genetics

## 2024-06-27 ENCOUNTER — Other Ambulatory Visit: Payer: Self-pay | Admitting: Medical Genetics

## 2024-06-27 DIAGNOSIS — Z006 Encounter for examination for normal comparison and control in clinical research program: Secondary | ICD-10-CM
